# Patient Record
Sex: Female | Born: 1940 | Race: White | Hispanic: No | Smoking: Former smoker
Health system: Southern US, Community
[De-identification: ages and names within clinical notes are randomized; demographics above are authoritative.]

## PROBLEM LIST (undated history)

## (undated) DIAGNOSIS — J45909 Unspecified asthma, uncomplicated: Secondary | ICD-10-CM

## (undated) DIAGNOSIS — I493 Ventricular premature depolarization: Secondary | ICD-10-CM

## (undated) DIAGNOSIS — F419 Anxiety disorder, unspecified: Secondary | ICD-10-CM

## (undated) DIAGNOSIS — I10 Essential (primary) hypertension: Secondary | ICD-10-CM

## (undated) HISTORY — DX: Ventricular premature depolarization: I49.3

## (undated) HISTORY — PX: ABDOMINAL HYSTERECTOMY: SHX81

## (undated) HISTORY — DX: Essential (primary) hypertension: I10

## (undated) HISTORY — PX: NO PAST SURGERIES: SHX2092

## (undated) HISTORY — DX: Anxiety disorder, unspecified: F41.9

---

## 1999-04-07 ENCOUNTER — Ambulatory Visit (HOSPITAL_BASED_OUTPATIENT_CLINIC_OR_DEPARTMENT_OTHER): Admission: RE | Admit: 1999-04-07 | Discharge: 1999-04-07 | Payer: Self-pay | Admitting: General Surgery

## 1999-05-01 ENCOUNTER — Encounter: Admission: RE | Admit: 1999-05-01 | Discharge: 1999-07-30 | Payer: Self-pay | Admitting: *Deleted

## 1999-11-05 ENCOUNTER — Encounter: Admission: RE | Admit: 1999-11-05 | Discharge: 1999-11-05 | Payer: Self-pay | Admitting: General Surgery

## 1999-11-05 ENCOUNTER — Encounter: Payer: Self-pay | Admitting: General Surgery

## 1999-12-05 ENCOUNTER — Ambulatory Visit (HOSPITAL_COMMUNITY): Admission: RE | Admit: 1999-12-05 | Discharge: 1999-12-05 | Payer: Self-pay | Admitting: Hematology & Oncology

## 2000-04-05 ENCOUNTER — Encounter: Payer: Self-pay | Admitting: General Surgery

## 2000-04-05 ENCOUNTER — Encounter: Admission: RE | Admit: 2000-04-05 | Discharge: 2000-04-05 | Payer: Self-pay | Admitting: *Deleted

## 2000-04-26 ENCOUNTER — Ambulatory Visit (HOSPITAL_COMMUNITY): Admission: RE | Admit: 2000-04-26 | Discharge: 2000-04-26 | Payer: Self-pay | Admitting: Hematology & Oncology

## 2000-04-26 ENCOUNTER — Encounter: Payer: Self-pay | Admitting: Hematology & Oncology

## 2000-05-21 ENCOUNTER — Encounter: Payer: Self-pay | Admitting: Neurology

## 2000-05-21 ENCOUNTER — Ambulatory Visit (HOSPITAL_COMMUNITY): Admission: RE | Admit: 2000-05-21 | Discharge: 2000-05-21 | Payer: Self-pay | Admitting: Neurology

## 2000-08-12 ENCOUNTER — Other Ambulatory Visit: Admission: RE | Admit: 2000-08-12 | Discharge: 2000-08-12 | Payer: Self-pay | Admitting: Gynecology

## 2000-09-27 ENCOUNTER — Encounter: Payer: Self-pay | Admitting: Gynecology

## 2000-09-27 ENCOUNTER — Encounter: Payer: Self-pay | Admitting: General Surgery

## 2000-09-27 ENCOUNTER — Encounter: Admission: RE | Admit: 2000-09-27 | Discharge: 2000-09-27 | Payer: Self-pay | Admitting: General Surgery

## 2000-09-27 ENCOUNTER — Encounter: Admission: RE | Admit: 2000-09-27 | Discharge: 2000-09-27 | Payer: Self-pay | Admitting: Gynecology

## 2001-03-24 ENCOUNTER — Encounter: Payer: Self-pay | Admitting: General Surgery

## 2001-03-24 ENCOUNTER — Encounter: Admission: RE | Admit: 2001-03-24 | Discharge: 2001-03-24 | Payer: Self-pay | Admitting: Hematology & Oncology

## 2001-08-24 ENCOUNTER — Other Ambulatory Visit: Admission: RE | Admit: 2001-08-24 | Discharge: 2001-08-24 | Payer: Self-pay | Admitting: Gynecology

## 2001-10-17 ENCOUNTER — Encounter: Payer: Self-pay | Admitting: General Surgery

## 2001-10-17 ENCOUNTER — Encounter: Admission: RE | Admit: 2001-10-17 | Discharge: 2001-10-17 | Payer: Self-pay | Admitting: General Surgery

## 2002-08-28 ENCOUNTER — Other Ambulatory Visit: Admission: RE | Admit: 2002-08-28 | Discharge: 2002-08-28 | Payer: Self-pay | Admitting: Gynecology

## 2002-10-18 ENCOUNTER — Encounter: Payer: Self-pay | Admitting: General Surgery

## 2002-10-18 ENCOUNTER — Encounter: Admission: RE | Admit: 2002-10-18 | Discharge: 2002-10-18 | Payer: Self-pay | Admitting: General Surgery

## 2003-09-03 ENCOUNTER — Other Ambulatory Visit: Admission: RE | Admit: 2003-09-03 | Discharge: 2003-09-03 | Payer: Self-pay | Admitting: Gynecology

## 2003-11-06 ENCOUNTER — Encounter: Admission: RE | Admit: 2003-11-06 | Discharge: 2003-11-06 | Payer: Self-pay | Admitting: Hematology & Oncology

## 2004-09-03 ENCOUNTER — Ambulatory Visit: Payer: Self-pay | Admitting: Hematology & Oncology

## 2004-10-06 ENCOUNTER — Other Ambulatory Visit: Admission: RE | Admit: 2004-10-06 | Discharge: 2004-10-06 | Payer: Self-pay | Admitting: Gynecology

## 2004-11-11 ENCOUNTER — Encounter: Admission: RE | Admit: 2004-11-11 | Discharge: 2004-11-11 | Payer: Self-pay | Admitting: General Surgery

## 2004-11-11 ENCOUNTER — Encounter (INDEPENDENT_AMBULATORY_CARE_PROVIDER_SITE_OTHER): Payer: Self-pay | Admitting: *Deleted

## 2004-11-18 ENCOUNTER — Encounter: Admission: RE | Admit: 2004-11-18 | Discharge: 2004-11-18 | Payer: Self-pay | Admitting: General Surgery

## 2004-12-02 ENCOUNTER — Encounter (INDEPENDENT_AMBULATORY_CARE_PROVIDER_SITE_OTHER): Payer: Self-pay | Admitting: *Deleted

## 2004-12-02 ENCOUNTER — Encounter: Admission: RE | Admit: 2004-12-02 | Discharge: 2004-12-02 | Payer: Self-pay | Admitting: General Surgery

## 2004-12-02 ENCOUNTER — Ambulatory Visit (HOSPITAL_BASED_OUTPATIENT_CLINIC_OR_DEPARTMENT_OTHER): Admission: RE | Admit: 2004-12-02 | Discharge: 2004-12-02 | Payer: Self-pay | Admitting: General Surgery

## 2004-12-02 ENCOUNTER — Ambulatory Visit (HOSPITAL_COMMUNITY): Admission: RE | Admit: 2004-12-02 | Discharge: 2004-12-02 | Payer: Self-pay | Admitting: General Surgery

## 2005-03-03 ENCOUNTER — Ambulatory Visit: Payer: Self-pay | Admitting: Hematology & Oncology

## 2005-06-01 ENCOUNTER — Encounter: Admission: RE | Admit: 2005-06-01 | Discharge: 2005-06-01 | Payer: Self-pay | Admitting: General Surgery

## 2005-09-01 ENCOUNTER — Ambulatory Visit: Payer: Self-pay | Admitting: Hematology & Oncology

## 2005-10-27 ENCOUNTER — Other Ambulatory Visit: Admission: RE | Admit: 2005-10-27 | Discharge: 2005-10-27 | Payer: Self-pay | Admitting: Gynecology

## 2005-12-11 ENCOUNTER — Encounter: Admission: RE | Admit: 2005-12-11 | Discharge: 2005-12-11 | Payer: Self-pay | Admitting: Hematology & Oncology

## 2006-08-30 ENCOUNTER — Ambulatory Visit: Payer: Self-pay | Admitting: Hematology & Oncology

## 2006-09-01 LAB — COMPREHENSIVE METABOLIC PANEL
ALT: 20 U/L (ref 0–35)
AST: 24 U/L (ref 0–37)
Albumin: 4.1 g/dL (ref 3.5–5.2)
Alkaline Phosphatase: 67 U/L (ref 39–117)
BUN: 16 mg/dL (ref 6–23)
CO2: 27 mEq/L (ref 19–32)
Calcium: 9.4 mg/dL (ref 8.4–10.5)
Chloride: 103 mEq/L (ref 96–112)
Creatinine, Ser: 0.73 mg/dL (ref 0.40–1.20)
Glucose, Bld: 155 mg/dL — ABNORMAL HIGH (ref 70–99)
Potassium: 4.3 mEq/L (ref 3.5–5.3)
Sodium: 140 mEq/L (ref 135–145)
Total Bilirubin: 0.5 mg/dL (ref 0.3–1.2)
Total Protein: 6.9 g/dL (ref 6.0–8.3)

## 2006-09-01 LAB — CBC WITH DIFFERENTIAL/PLATELET
BASO%: 0.6 % (ref 0.0–2.0)
Basophils Absolute: 0 10*3/uL (ref 0.0–0.1)
EOS%: 5.8 % (ref 0.0–7.0)
Eosinophils Absolute: 0.3 10*3/uL (ref 0.0–0.5)
HCT: 40 % (ref 34.8–46.6)
HGB: 13.6 g/dL (ref 11.6–15.9)
LYMPH%: 23.3 % (ref 14.0–48.0)
MCH: 30.1 pg (ref 26.0–34.0)
MCHC: 34.2 g/dL (ref 32.0–36.0)
MCV: 88.2 fL (ref 81.0–101.0)
MONO#: 0.3 10*3/uL (ref 0.1–0.9)
MONO%: 7.8 % (ref 0.0–13.0)
NEUT#: 2.8 10*3/uL (ref 1.5–6.5)
NEUT%: 62.5 % (ref 39.6–76.8)
Platelets: 263 10*3/uL (ref 145–400)
RBC: 4.53 10*6/uL (ref 3.70–5.32)
RDW: 13.5 % (ref 11.3–14.5)
WBC: 4.5 10*3/uL (ref 3.9–10.0)
lymph#: 1 10*3/uL (ref 0.9–3.3)

## 2006-12-13 ENCOUNTER — Encounter: Admission: RE | Admit: 2006-12-13 | Discharge: 2006-12-13 | Payer: Self-pay | Admitting: Hematology & Oncology

## 2006-12-16 ENCOUNTER — Other Ambulatory Visit: Admission: RE | Admit: 2006-12-16 | Discharge: 2006-12-16 | Payer: Self-pay | Admitting: Gynecology

## 2007-01-25 ENCOUNTER — Ambulatory Visit: Payer: Self-pay | Admitting: Internal Medicine

## 2007-02-10 ENCOUNTER — Ambulatory Visit: Payer: Self-pay | Admitting: Internal Medicine

## 2007-09-07 ENCOUNTER — Ambulatory Visit: Payer: Self-pay | Admitting: Hematology & Oncology

## 2007-09-09 LAB — CBC WITH DIFFERENTIAL/PLATELET
BASO%: 0.9 % (ref 0.0–2.0)
Basophils Absolute: 0 10*3/uL (ref 0.0–0.1)
EOS%: 6 % (ref 0.0–7.0)
Eosinophils Absolute: 0.3 10*3/uL (ref 0.0–0.5)
HCT: 37.9 % (ref 34.8–46.6)
HGB: 13.3 g/dL (ref 11.6–15.9)
LYMPH%: 25.1 % (ref 14.0–48.0)
MCH: 30.9 pg (ref 26.0–34.0)
MCHC: 35.2 g/dL (ref 32.0–36.0)
MCV: 87.9 fL (ref 81.0–101.0)
MONO#: 0.4 10*3/uL (ref 0.1–0.9)
MONO%: 9.2 % (ref 0.0–13.0)
NEUT#: 2.8 10*3/uL (ref 1.5–6.5)
NEUT%: 58.8 % (ref 39.6–76.8)
Platelets: 215 10*3/uL (ref 145–400)
RBC: 4.31 10*6/uL (ref 3.70–5.32)
RDW: 13.3 % (ref 11.3–14.5)
WBC: 4.8 10*3/uL (ref 3.9–10.0)
lymph#: 1.2 10*3/uL (ref 0.9–3.3)

## 2007-09-09 LAB — COMPREHENSIVE METABOLIC PANEL
Albumin: 4.1 g/dL (ref 3.5–5.2)
BUN: 15 mg/dL (ref 6–23)
CO2: 25 mEq/L (ref 19–32)
Calcium: 8.8 mg/dL (ref 8.4–10.5)
Chloride: 104 mEq/L (ref 96–112)
Glucose, Bld: 97 mg/dL (ref 70–99)
Potassium: 4.3 mEq/L (ref 3.5–5.3)

## 2007-12-16 ENCOUNTER — Encounter: Admission: RE | Admit: 2007-12-16 | Discharge: 2007-12-16 | Payer: Self-pay | Admitting: Hematology & Oncology

## 2008-01-26 ENCOUNTER — Encounter: Admission: RE | Admit: 2008-01-26 | Discharge: 2008-01-26 | Payer: Self-pay | Admitting: Gynecology

## 2008-01-31 ENCOUNTER — Encounter (INDEPENDENT_AMBULATORY_CARE_PROVIDER_SITE_OTHER): Payer: Self-pay | Admitting: *Deleted

## 2008-09-19 ENCOUNTER — Ambulatory Visit: Payer: Self-pay | Admitting: Hematology & Oncology

## 2008-09-20 LAB — CBC WITH DIFFERENTIAL (CANCER CENTER ONLY)
BASO%: 0.7 % (ref 0.0–2.0)
EOS%: 7.5 % — ABNORMAL HIGH (ref 0.0–7.0)
HGB: 14 g/dL (ref 11.6–15.9)
LYMPH#: 1.4 10*3/uL (ref 0.9–3.3)
LYMPH%: 29.1 % (ref 14.0–48.0)
MCH: 30.5 pg (ref 26.0–34.0)
MCV: 88 fL (ref 81–101)
NEUT%: 55.4 % (ref 39.6–80.0)
Platelets: 217 10*3/uL (ref 145–400)
RBC: 4.6 10*6/uL (ref 3.70–5.32)
RDW: 11.3 % (ref 10.5–14.6)
WBC: 5 10*3/uL (ref 3.9–10.0)

## 2008-09-20 LAB — COMPREHENSIVE METABOLIC PANEL
ALT: 18 U/L (ref 0–35)
AST: 20 U/L (ref 0–37)
Albumin: 4.2 g/dL (ref 3.5–5.2)
Alkaline Phosphatase: 65 U/L (ref 39–117)
BUN: 18 mg/dL (ref 6–23)
CO2: 25 mEq/L (ref 19–32)
Calcium: 9.2 mg/dL (ref 8.4–10.5)
Chloride: 105 mEq/L (ref 96–112)
Creatinine, Ser: 0.76 mg/dL (ref 0.40–1.20)
Glucose, Bld: 148 mg/dL — ABNORMAL HIGH (ref 70–99)
Potassium: 5.1 mEq/L (ref 3.5–5.3)
Sodium: 140 mEq/L (ref 135–145)
Total Bilirubin: 0.7 mg/dL (ref 0.3–1.2)
Total Protein: 7 g/dL (ref 6.0–8.3)

## 2009-01-31 ENCOUNTER — Encounter: Admission: RE | Admit: 2009-01-31 | Discharge: 2009-01-31 | Payer: Self-pay | Admitting: Gynecology

## 2009-09-18 ENCOUNTER — Ambulatory Visit: Payer: Self-pay | Admitting: Hematology & Oncology

## 2009-09-19 LAB — CBC WITH DIFFERENTIAL (CANCER CENTER ONLY)
BASO#: 0 10*3/uL (ref 0.0–0.2)
EOS%: 5.8 % (ref 0.0–7.0)
Eosinophils Absolute: 0.3 10*3/uL (ref 0.0–0.5)
HGB: 13.9 g/dL (ref 11.6–15.9)
LYMPH#: 1.3 10*3/uL (ref 0.9–3.3)
NEUT#: 2.4 10*3/uL (ref 1.5–6.5)
Platelets: 215 10*3/uL (ref 145–400)
RBC: 4.59 10*6/uL (ref 3.70–5.32)
WBC: 4.3 10*3/uL (ref 3.9–10.0)

## 2009-09-20 LAB — COMPREHENSIVE METABOLIC PANEL
ALT: 18 U/L (ref 0–35)
AST: 22 U/L (ref 0–37)
Albumin: 4.5 g/dL (ref 3.5–5.2)
Alkaline Phosphatase: 69 U/L (ref 39–117)
BUN: 20 mg/dL (ref 6–23)
CO2: 27 mEq/L (ref 19–32)
Calcium: 9.2 mg/dL (ref 8.4–10.5)
Chloride: 104 mEq/L (ref 96–112)
Creatinine, Ser: 0.75 mg/dL (ref 0.40–1.20)
Glucose, Bld: 112 mg/dL — ABNORMAL HIGH (ref 70–99)
Potassium: 3.9 mEq/L (ref 3.5–5.3)
Sodium: 140 mEq/L (ref 135–145)
Total Bilirubin: 0.8 mg/dL (ref 0.3–1.2)
Total Protein: 7.3 g/dL (ref 6.0–8.3)

## 2009-09-20 LAB — VITAMIN D 25 HYDROXY (VIT D DEFICIENCY, FRACTURES): Vit D, 25-Hydroxy: 31 ng/mL (ref 30–89)

## 2010-02-11 ENCOUNTER — Encounter: Admission: RE | Admit: 2010-02-11 | Discharge: 2010-02-11 | Payer: Self-pay | Admitting: Hematology & Oncology

## 2010-09-17 ENCOUNTER — Ambulatory Visit: Payer: Self-pay | Admitting: Hematology & Oncology

## 2010-10-16 LAB — CBC WITH DIFFERENTIAL (CANCER CENTER ONLY)
BASO#: 0 10*3/uL (ref 0.0–0.2)
BASO%: 0.4 % (ref 0.0–2.0)
EOS%: 6.4 % (ref 0.0–7.0)
Eosinophils Absolute: 0.3 10*3/uL (ref 0.0–0.5)
HCT: 40.4 % (ref 34.8–46.6)
HGB: 13.6 g/dL (ref 11.6–15.9)
LYMPH#: 1.2 10*3/uL (ref 0.9–3.3)
LYMPH%: 27.8 % (ref 14.0–48.0)
MCH: 30.1 pg (ref 26.0–34.0)
MCHC: 33.7 g/dL (ref 32.0–36.0)
MCV: 89 fL (ref 81–101)
MONO#: 0.3 10*3/uL (ref 0.1–0.9)
MONO%: 7.6 % (ref 0.0–13.0)
NEUT#: 2.5 10*3/uL (ref 1.5–6.5)
NEUT%: 57.8 % (ref 39.6–80.0)
Platelets: 216 10*3/uL (ref 145–400)
RBC: 4.53 10*6/uL (ref 3.70–5.32)
RDW: 11.3 % (ref 10.5–14.6)
WBC: 4.2 10*3/uL (ref 3.9–10.0)

## 2010-10-17 LAB — COMPREHENSIVE METABOLIC PANEL
ALT: 15 U/L (ref 0–35)
AST: 24 U/L (ref 0–37)
Albumin: 4.3 g/dL (ref 3.5–5.2)
Alkaline Phosphatase: 62 U/L (ref 39–117)
BUN: 17 mg/dL (ref 6–23)
CO2: 27 mEq/L (ref 19–32)
Calcium: 9.3 mg/dL (ref 8.4–10.5)
Chloride: 104 mEq/L (ref 96–112)
Creatinine, Ser: 0.68 mg/dL (ref 0.40–1.20)
Glucose, Bld: 112 mg/dL — ABNORMAL HIGH (ref 70–99)
Potassium: 4 mEq/L (ref 3.5–5.3)
Sodium: 140 mEq/L (ref 135–145)
Total Bilirubin: 0.5 mg/dL (ref 0.3–1.2)
Total Protein: 6.9 g/dL (ref 6.0–8.3)

## 2010-10-17 LAB — TSH: TSH: 3.994 u[IU]/mL (ref 0.350–4.500)

## 2010-10-17 LAB — VITAMIN D 25 HYDROXY (VIT D DEFICIENCY, FRACTURES): Vit D, 25-Hydroxy: 58 ng/mL (ref 30–89)

## 2011-02-02 ENCOUNTER — Other Ambulatory Visit: Payer: Self-pay | Admitting: Gynecology

## 2011-02-02 DIAGNOSIS — Z1231 Encounter for screening mammogram for malignant neoplasm of breast: Secondary | ICD-10-CM

## 2011-02-13 ENCOUNTER — Ambulatory Visit
Admission: RE | Admit: 2011-02-13 | Discharge: 2011-02-13 | Disposition: A | Discharge status: Home / Self Care | Payer: Medicare Other | Source: Ambulatory Visit | Attending: Gynecology | Admitting: Gynecology

## 2011-02-13 DIAGNOSIS — Z1231 Encounter for screening mammogram for malignant neoplasm of breast: Secondary | ICD-10-CM

## 2011-03-06 NOTE — Op Note (Signed)
NAMELORRE, OPDAHL NO.:  [id number]   MEDICAL RECORD NO.:  [id number]          PATIENT TYPE:  AMB   LOCATION:  DSC                          FACILITY:  [facility]   PHYSICIAN:  Rose Phi. Maple Hudson, M.D.   DATE OF BIRTH:  Feb 09, 1941   DATE OF PROCEDURE:  12/02/2004  DATE OF DISCHARGE:                                 OPERATIVE REPORT   PREOPERATIVE DIAGNOSIS:  Abnormal left breast mammogram.   POSTOPERATIVE DIAGNOSIS:  Abnormal left breast mammogram.   OPERATION:  Left breast biopsy with needle localization and specimen  mammogram.   SURGEON:  Rose Phi. Maple Hudson, M.D.   ANESTHESIA:  MAC.   OPERATIVE PROCEDURE:  This 70 year old female had previously had a  lumpectomy and sentinel node biopsy and subsequent radiation therapy for  treatment of her left breast cancer about 5 years ago. Her recent mammogram  had showed a new abnormality in the medial portion of her left breast, away  from the lumpectomy site, and a core biopsy had shown a necrosis. MRI showed  enhancement and suggestion that this might be more than that and so she is  scheduled for excisional biopsy.   Prior to coming to the operating room, a localizing wire had been placed in  the left breast.   The patient was placed on the operating table with arms extended on the arm  boards. The left breast was prepped and draped in usual fashion. The target  point was at about the 9 o'clock position of her left breast and, using the  previously placed wire as a guide, a transverse incision was outlined over  the marked area. This area was then thoroughly infiltrated with 0.25%  Marcaine. Incision was made and the wire exposed and then the wire and  surrounding tissue were excised. Hemostasis obtained with cautery.   Specimen mammogram confirmed the removal of the lesion.   With good hemostasis, a subcuticular closure of 4-0 Monocryl and Steri-  Strips was carried out. Dressing applied. The patient transferred to  recovery room in satisfactory condition having tolerated procedure well.      PRY/MEDQ  D:  12/02/2004  T:  12/02/2004  Job:  [id number]

## 2011-03-06 NOTE — Assessment & Plan Note (Signed)
[facility]                             PULMONARY OFFICE NOTE   TANGANIKA, BARRADAS                       MRN:          [id number]  DATE:01/25/2007                            DOB:          1940-12-12    ALLERGY CONSULTATION   PROBLEM:  A 70 year old woman seen on kind referral from Dr. Allyson Sabal in  allergy consultation concerned about recurrent sinus infections and  asthma.   HISTORY:  She is a remote smoker.  She describes 40 years of recurrent  sinus infections never treated surgically and usually involving the  right side of her face.  She has tended to have sinus congestion and  asthma worst in the fall but common at season changes and apt to happen  at any time during the year.  Definite triggers include blowing leaves  and she said it happened that way this time.  She developed nasal  congestion not responsive to saline lavage, Nasonex, and a heated sinus  mask.  She took Augmentin with incomplete clearing.  Six weeks later she  started a second round of Augmentin.  She has been flying a lot,  visiting sick relatives, and believes the plane flights have contributed  to her problems.  She has been having daily asthma for several weeks and  is waking with cold sweats which is a new experience for her, different  from her endocrine hot flashes years ago.  She was given Avelox, which  caused nausea.  This was changed to Levaquin and she is completing a  week.  Lungs still feel full and she still has a pressure sensation  over the right maxillary sinus.  She has had several days when  temperature reached 100 degrees at home.  Nasal discharge is pea-green.   MEDICATION:  1. Calcium and multivitamins.  2. Paroxetine 20 mg.  3. Aspirin 81 mg.  4. Toprol-XL 25 mg.  5. Zyrtec.  6. Levaquin.   Drug intolerant of:  1. TETANUS TOXOID.  2. SULFA.  3. THEO-DUR.   P.r.n. use of a Maxair inhaler.   PAST HISTORY:  1. Hypertension.  2.  Palpitations.  3. Echocardiogram showing borderline left ventricular hypertrophy with      normal ejection fraction.  4. Asthma.  5. Left breast lumpectomy, radiation therapy and Arimidex (Dr. Francina Ames, Dr. Arlan Organ).  6. Recurrent sinusitis.  7. Skin test positive to mold, leaves, pollen, and horses as a child      with allergy vaccine then.  She avoids triggers.  8. 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  9. Contact dermatitis LATEX; no recognized problem with contrast dye      or aspirin.  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   SOCIAL HISTORY:  Quit smoking in 1980.  Occasional glass of wine.  Widowed with grown children, living alone.  She owns a business.  Walks  5 days per week.   FAMILY HISTORY:  Positive for emphysema, rheumatism, and cancer.  Daughter with strong insect sting sensitivity.  OBJECTIVE:  VITAL SIGNS:  Weight 166 pounds, BP 122/60, pulse regular at  68, room air saturation 98%.  HEENT:  Bilateral periorbital edema.  Conjunctivae are not injected.  Nasal airway is mildly crusted but not obstructed with no visible  polyps, no postnasal drip.  Voice quality is normal.  Palate spacing  2/4.  Oral mucosa normal.  No stridor.  No neck vein distention.  CHEST:  Clear.  HEART SOUNDS:  Regular without murmur or gallop.  EXTREMITIES:  Without cyanosis, clubbing or edema.   Chest x-ray:  She brings a film from [place] at [facility] dated  January 21, 2007, with no report.  To me the parenchyma looks clear and  heart size normal with no acute process.  There is a large gastric air  bubble suggesting aerophagia.   IMPRESSION:  1. Asthma.  2. Allergic rhinitis.  3. Recurrent rhinosinusitis, now persistent after partial response to      Augmentin and then to Levaquin.  4. History of tachypalpitations and hypertension, because of which she      is cautious about the use of stimulant medications.  We discussed       options in this regard.   PLAN:  1. We are going to extend her Levaquin 500 mg for 7 more days.  2. Cautious trial of Sudafed-PE as tolerated for nasal congestion.  3. Trial of Symbicort 160/4.5 two puffs b.i.d. with stimulant and      steroid side-effects.  4. We refilled her Maxair Autohaler for two puff q.i.d. p.r.n. use.  5. Schedule return 3 weeks, earlier p.r.n.     Clinton D. Maple Hudson, MD, Tonny Bollman, FACP  Electronically Signed    CDY/MedQ  DD: 01/25/2007  DT: 01/26/2007  Job #: [id number]   cc:   Nanetta Batty, M.D.  [facility] [employer]

## 2011-03-06 NOTE — Assessment & Plan Note (Signed)
[facility]                             PULMONARY OFFICE NOTE   Kayla Hays, Kayla Hays                       MRN:          [id number]  DATE:02/10/2007                            DOB:          1941-02-01    PROBLEM LIST:  1. Asthma.  2. Allergic rhinitis.  3. Recurrent sinusitis.  4. History of left breast cancer.   HISTORY:  She says she is breathing well with Symbicort and has not  needed her Maxair inhalers since last here.  She is still having some  right maxillary pressure pain.  Nasal discharge has changed from green  to white.  She forgot to try the Sudafed as we had discussed and  finished the Levaquin that she was taking when here last without further  antibiotic.  She is doing saline nasal lavage.  She has an ENT  appointment pending in May but does not remember who she is going to  see.   MEDICATIONS:  1. Paxil 20 mg.  2. Aspirin 81 mg.  3. Toprol XL 25 mg.  4. Zyrtec.  5. Symbicort 160/4.5 (tolerated without tachy palpitations).  6. Maxair rescue inhaler not being used routinely now.   DRUG INTOLERANCE:  TETANUS TOXOID, SULFA, THEO-DUR.   OBJECTIVE:  VITAL SIGNS: Weight 167 pounds.  Blood pressure 102/70,  pulse rate 76, room air saturation 97%.  HEENT:  Nasal mucosa is definitely pale and a bit boggy without polyps,  obvious mucus,  postnasal drip.  There is no adenopathy.  She does not  sound nasal.  There is no periorbital edema.  LUNGS:  Clear.   IMPRESSION:  1. Rhinitis, probable residual maxillary sinusitis.  2. Asthma, now well controlled on Symbicort.  We are wanting to be a      little careful with stimulant medications because of her cardiac      rhythm issue.   PLAN:  1. Nasal inhalation treatment with Neo-Synephrine.  2. Try Sudafed for tolerance hoping to get her right maxillary sinus      to drain.  3. Keep ENT appointment.  4. Repeat a week of Levaquin now at 500 mg daily.  5. Schedule return here in 6  months, earlier p.r.n.     Clinton D. Maple Hudson, MD, Tonny Bollman, FACP  Electronically Signed    CDY/MedQ  DD: 02/13/2007  DT: 02/13/2007  Job #: [id number]   cc:   [facility], [facility]  Nanetta Batty, M.D.

## 2011-05-11 ENCOUNTER — Telehealth: Payer: Self-pay | Admitting: Internal Medicine

## 2011-05-11 NOTE — Telephone Encounter (Signed)
Spoke with patient-aware that she needs to reestablish with [facility] as it has been over three years since she was seen last and she would need to call her PCP for any inhalers for the time being. Pt understood and will speak with her PCP; if she has any troubles with this and needs an appt she will call our office to speak with myself.

## 2011-10-15 ENCOUNTER — Other Ambulatory Visit: Payer: Self-pay | Admitting: Hematology & Oncology

## 2011-10-15 ENCOUNTER — Ambulatory Visit (HOSPITAL_BASED_OUTPATIENT_CLINIC_OR_DEPARTMENT_OTHER): Payer: Medicare Other | Admitting: Hematology & Oncology

## 2011-10-15 ENCOUNTER — Other Ambulatory Visit (HOSPITAL_BASED_OUTPATIENT_CLINIC_OR_DEPARTMENT_OTHER): Payer: Medicare Other | Admitting: Lab

## 2011-10-15 VITALS — BP 138/86 | HR 74 | Temp 97.0°F | Ht 65.5 in | Wt 145.0 lb

## 2011-10-15 DIAGNOSIS — C50919 Malignant neoplasm of unspecified site of unspecified female breast: Secondary | ICD-10-CM

## 2011-10-15 DIAGNOSIS — Z87898 Personal history of other specified conditions: Secondary | ICD-10-CM

## 2011-10-15 LAB — CBC WITH DIFFERENTIAL (CANCER CENTER ONLY)
BASO%: 1.2 % (ref 0.0–2.0)
EOS%: 3.1 % (ref 0.0–7.0)
HGB: 13.6 g/dL (ref 11.6–15.9)
LYMPH#: 0.9 10*3/uL (ref 0.9–3.3)
MCHC: 33.7 g/dL (ref 32.0–36.0)
NEUT#: 3.4 10*3/uL (ref 1.5–6.5)
RDW: 12.6 % (ref 11.1–15.7)

## 2011-10-15 LAB — COMPREHENSIVE METABOLIC PANEL
ALT: 19 U/L (ref 0–35)
AST: 23 U/L (ref 0–37)
Albumin: 4.4 g/dL (ref 3.5–5.2)
Alkaline Phosphatase: 60 U/L (ref 39–117)
Potassium: 4.3 mEq/L (ref 3.5–5.3)
Sodium: 136 mEq/L (ref 135–145)
Total Bilirubin: 0.7 mg/dL (ref 0.3–1.2)
Total Protein: 7.1 g/dL (ref 6.0–8.3)

## 2011-10-16 LAB — COMPREHENSIVE METABOLIC PANEL
Albumin: 4.4 g/dL (ref 3.5–5.2)
Albumin: 4.4 g/dL (ref 3.5–5.2)
Alkaline Phosphatase: 60 U/L (ref 39–117)
Alkaline Phosphatase: 60 U/L (ref 39–117)
BUN: 19 mg/dL (ref 6–23)
Calcium: 9.5 mg/dL (ref 8.4–10.5)
Chloride: 101 mEq/L (ref 96–112)
Creatinine, Ser: 0.78 mg/dL (ref 0.50–1.10)
Glucose, Bld: 147 mg/dL — ABNORMAL HIGH (ref 70–99)
Glucose, Bld: 147 mg/dL — ABNORMAL HIGH (ref 70–99)
Potassium: 4.3 mEq/L (ref 3.5–5.3)
Potassium: 4.3 mEq/L (ref 3.5–5.3)
Sodium: 136 mEq/L (ref 135–145)
Total Bilirubin: 0.7 mg/dL (ref 0.3–1.2)
Total Protein: 7.1 g/dL (ref 6.0–8.3)
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
## 2011-10-27 DIAGNOSIS — F411 Generalized anxiety disorder: Secondary | ICD-10-CM | POA: Diagnosis not present

## 2011-10-27 DIAGNOSIS — Z23 Encounter for immunization: Secondary | ICD-10-CM | POA: Diagnosis not present

## 2011-11-19 DIAGNOSIS — M79609 Pain in unspecified limb: Secondary | ICD-10-CM | POA: Diagnosis not present

## 2011-11-30 DIAGNOSIS — I10 Essential (primary) hypertension: Secondary | ICD-10-CM | POA: Diagnosis not present

## 2011-11-30 DIAGNOSIS — J309 Allergic rhinitis, unspecified: Secondary | ICD-10-CM | POA: Diagnosis not present

## 2011-11-30 DIAGNOSIS — Z853 Personal history of malignant neoplasm of breast: Secondary | ICD-10-CM | POA: Diagnosis not present

## 2011-11-30 DIAGNOSIS — Z0000 Encounter for general adult medical examination without abnormal findings: Secondary | ICD-10-CM | POA: Diagnosis not present

## 2011-11-30 DIAGNOSIS — R82998 Other abnormal findings in urine: Secondary | ICD-10-CM | POA: Diagnosis not present

## 2011-11-30 DIAGNOSIS — G459 Transient cerebral ischemic attack, unspecified: Secondary | ICD-10-CM | POA: Diagnosis not present

## 2011-11-30 DIAGNOSIS — Z23 Encounter for immunization: Secondary | ICD-10-CM | POA: Diagnosis not present

## 2011-11-30 DIAGNOSIS — J45909 Unspecified asthma, uncomplicated: Secondary | ICD-10-CM | POA: Diagnosis not present

## 2011-11-30 DIAGNOSIS — D0590 Unspecified type of carcinoma in situ of unspecified breast: Secondary | ICD-10-CM | POA: Diagnosis not present

## 2012-02-01 DIAGNOSIS — B373 Candidiasis of vulva and vagina: Secondary | ICD-10-CM | POA: Diagnosis not present

## 2012-03-19 DIAGNOSIS — R300 Dysuria: Secondary | ICD-10-CM | POA: Diagnosis not present

## 2012-03-29 ENCOUNTER — Other Ambulatory Visit: Payer: Self-pay | Admitting: Hematology & Oncology

## 2012-03-29 DIAGNOSIS — Z1231 Encounter for screening mammogram for malignant neoplasm of breast: Secondary | ICD-10-CM

## 2012-03-30 DIAGNOSIS — B373 Candidiasis of vulva and vagina: Secondary | ICD-10-CM | POA: Diagnosis not present

## 2012-04-20 ENCOUNTER — Ambulatory Visit
Admission: RE | Admit: 2012-04-20 | Discharge: 2012-04-20 | Disposition: A | Discharge status: Home / Self Care | Payer: Medicare Other | Source: Ambulatory Visit | Attending: Hematology & Oncology | Admitting: Hematology & Oncology

## 2012-04-20 DIAGNOSIS — Z1231 Encounter for screening mammogram for malignant neoplasm of breast: Secondary | ICD-10-CM

## 2012-05-07 DIAGNOSIS — W57XXXA Bitten or stung by nonvenomous insect and other nonvenomous arthropods, initial encounter: Secondary | ICD-10-CM | POA: Diagnosis not present

## 2012-05-11 DIAGNOSIS — I10 Essential (primary) hypertension: Secondary | ICD-10-CM | POA: Diagnosis not present

## 2012-05-30 DIAGNOSIS — F411 Generalized anxiety disorder: Secondary | ICD-10-CM | POA: Diagnosis not present

## 2012-07-14 ENCOUNTER — Other Ambulatory Visit: Payer: Self-pay

## 2012-07-14 DIAGNOSIS — C44621 Squamous cell carcinoma of skin of unspecified upper limb, including shoulder: Secondary | ICD-10-CM | POA: Diagnosis not present

## 2012-07-14 DIAGNOSIS — L821 Other seborrheic keratosis: Secondary | ICD-10-CM | POA: Diagnosis not present

## 2012-08-02 DIAGNOSIS — H2510 Age-related nuclear cataract, unspecified eye: Secondary | ICD-10-CM | POA: Diagnosis not present

## 2012-08-04 ENCOUNTER — Other Ambulatory Visit: Payer: Self-pay

## 2012-08-04 DIAGNOSIS — C44621 Squamous cell carcinoma of skin of unspecified upper limb, including shoulder: Secondary | ICD-10-CM | POA: Diagnosis not present

## 2012-08-27 DIAGNOSIS — Z23 Encounter for immunization: Secondary | ICD-10-CM | POA: Diagnosis not present

## 2012-11-03 DIAGNOSIS — L570 Actinic keratosis: Secondary | ICD-10-CM | POA: Diagnosis not present

## 2012-11-03 DIAGNOSIS — L821 Other seborrheic keratosis: Secondary | ICD-10-CM | POA: Diagnosis not present

## 2012-11-22 ENCOUNTER — Other Ambulatory Visit: Payer: Self-pay | Admitting: Obstetrics and Gynecology

## 2012-11-22 DIAGNOSIS — E2839 Other primary ovarian failure: Secondary | ICD-10-CM | POA: Diagnosis not present

## 2012-11-22 DIAGNOSIS — Z1231 Encounter for screening mammogram for malignant neoplasm of breast: Secondary | ICD-10-CM

## 2012-11-22 DIAGNOSIS — M899 Disorder of bone, unspecified: Secondary | ICD-10-CM

## 2012-11-22 DIAGNOSIS — Z01419 Encounter for gynecological examination (general) (routine) without abnormal findings: Secondary | ICD-10-CM | POA: Diagnosis not present

## 2012-11-22 DIAGNOSIS — T3309XA Superficial frostbite of other part of head, initial encounter: Secondary | ICD-10-CM | POA: Diagnosis not present

## 2012-11-22 DIAGNOSIS — M949 Disorder of cartilage, unspecified: Secondary | ICD-10-CM | POA: Diagnosis not present

## 2013-02-10 DIAGNOSIS — H01119 Allergic dermatitis of unspecified eye, unspecified eyelid: Secondary | ICD-10-CM | POA: Diagnosis not present

## 2013-04-16 ENCOUNTER — Other Ambulatory Visit: Payer: Self-pay | Admitting: Cardiovascular Disease

## 2013-04-18 DIAGNOSIS — J019 Acute sinusitis, unspecified: Secondary | ICD-10-CM | POA: Diagnosis not present

## 2013-04-24 ENCOUNTER — Ambulatory Visit: Payer: Medicare Other

## 2013-04-24 ENCOUNTER — Other Ambulatory Visit: Payer: Medicare Other

## 2013-05-09 DIAGNOSIS — R413 Other amnesia: Secondary | ICD-10-CM | POA: Diagnosis not present

## 2013-05-09 DIAGNOSIS — R5383 Other fatigue: Secondary | ICD-10-CM | POA: Diagnosis not present

## 2013-05-09 DIAGNOSIS — F411 Generalized anxiety disorder: Secondary | ICD-10-CM | POA: Diagnosis not present

## 2013-05-09 DIAGNOSIS — R5381 Other malaise: Secondary | ICD-10-CM | POA: Diagnosis not present

## 2013-05-09 DIAGNOSIS — F341 Dysthymic disorder: Secondary | ICD-10-CM | POA: Diagnosis not present

## 2013-05-09 DIAGNOSIS — F329 Major depressive disorder, single episode, unspecified: Secondary | ICD-10-CM | POA: Diagnosis not present

## 2013-05-12 DIAGNOSIS — F0390 Unspecified dementia without behavioral disturbance: Secondary | ICD-10-CM | POA: Diagnosis not present

## 2013-05-12 DIAGNOSIS — F09 Unspecified mental disorder due to known physiological condition: Secondary | ICD-10-CM | POA: Diagnosis not present

## 2013-05-12 DIAGNOSIS — F411 Generalized anxiety disorder: Secondary | ICD-10-CM | POA: Diagnosis not present

## 2013-05-12 DIAGNOSIS — R413 Other amnesia: Secondary | ICD-10-CM | POA: Diagnosis not present

## 2013-05-18 ENCOUNTER — Encounter: Payer: Self-pay | Admitting: Cardiovascular Disease

## 2013-05-18 ENCOUNTER — Ambulatory Visit (INDEPENDENT_AMBULATORY_CARE_PROVIDER_SITE_OTHER): Payer: Medicare Other | Admitting: Cardiovascular Disease

## 2013-05-18 VITALS — BP 160/90 | HR 62 | Ht 68.0 in | Wt 139.0 lb

## 2013-05-18 DIAGNOSIS — R4182 Altered mental status, unspecified: Secondary | ICD-10-CM | POA: Diagnosis not present

## 2013-05-18 DIAGNOSIS — R413 Other amnesia: Secondary | ICD-10-CM

## 2013-05-18 DIAGNOSIS — Z09 Encounter for follow-up examination after completed treatment for conditions other than malignant neoplasm: Secondary | ICD-10-CM | POA: Diagnosis not present

## 2013-05-18 DIAGNOSIS — I4949 Other premature depolarization: Secondary | ICD-10-CM | POA: Diagnosis not present

## 2013-05-18 DIAGNOSIS — I10 Essential (primary) hypertension: Secondary | ICD-10-CM

## 2013-05-18 DIAGNOSIS — I493 Ventricular premature depolarization: Secondary | ICD-10-CM

## 2013-05-18 DIAGNOSIS — G3109 Other frontotemporal dementia: Secondary | ICD-10-CM | POA: Diagnosis not present

## 2013-05-18 DIAGNOSIS — F0280 Dementia in other diseases classified elsewhere without behavioral disturbance: Secondary | ICD-10-CM | POA: Diagnosis not present

## 2013-05-18 DIAGNOSIS — G309 Alzheimer's disease, unspecified: Secondary | ICD-10-CM | POA: Insufficient documentation

## 2013-05-18 NOTE — Progress Notes (Signed)
05/18/2013 Kayla Hays   06-Nov-1940  [id number]  Primary Physician Karleen Hampshire, MD Primary Cardiologist:Gonsalo Cuthbertson Erlene Quan MD FACP,FACC,FAHA, FSCAI   HPI:  The patient is a very pleasant, 72 year old, thin-appearing, widowed Caucasian female, mother of 3, grandmother to 4 grandchildren who I last saw 7 months ago. She was at one time married to one of my patients, Anabel Halon, who died 8 years ago. She is the mother of 2 children, grandmother to 4 grandchildren (2 boys and 2 girls.) She is under a lot of stress both personally and professionally and had been dating a new gentleman who lives in [place] and driving back and forth. I adjusted her medications which resulted in improvement in her symptoms and her blood pressure. Since I saw her a year ago she's remained cardiograph was stable. She had a sudden episode of short-term memory loss and is seeing a psychologist in [place] who is embarking on a workup for TIA/stroke with multiple imaging studies.   Current Outpatient Prescriptions  Medication Sig Dispense Refill  . albuterol (PROVENTIL HFA;VENTOLIN HFA) 108 (90 BASE) MCG/ACT inhaler Inhale 2 puffs into the lungs every 6 (six) hours as needed for wheezing.      Marland Kitchen aspirin 81 MG tablet Take 81 mg by mouth daily. 2 tablets daily      . Calcium Carbonate-Vitamin D (CALCIUM 500 + D PO) Take 500 mg by mouth daily.      . cetirizine (ZYRTEC) 10 MG tablet Take 10 mg by mouth daily.      Marland Kitchen escitalopram (LEXAPRO) 10 MG tablet Take 10 mg by mouth daily.        Marland Kitchen lisinopril (PRINIVIL,ZESTRIL) 2.5 MG tablet Take 2.5 mg by mouth daily.        Marland Kitchen LORazepam (ATIVAN) 0.5 MG tablet Take 0.5 mg by mouth every 8 (eight) hours.        . metoprolol succinate (TOPROL-XL) 50 MG 24 hr tablet TAKE 1 1/2 TABLET BY MOUTH EVERY DAY  45 tablet  1  . Multiple Vitamin (MULTIVITAMIN) capsule Take 1 capsule by mouth daily.       No current facility-administered medications for this visit.    Allergies    Allergen Reactions  . Tetanus Toxoids Other (See Comments)    Passes out  . Theophyllines Other (See Comments)    Passes out    History   Social History  . Marital Status: Widowed    Spouse Name: N/A    Number of Children: N/A  . Years of Education: N/A   Occupational History  . Not on file.   Social History Main Topics  . Smoking status: Former Smoker    Quit date: 05/18/1988  . Smokeless tobacco: Not on file  . Alcohol Use: Not on file  . Drug Use: Not on file  . Sexually Active: Not on file   Other Topics Concern  . Not on file   Social History Narrative  . No narrative on file     Review of Systems: General: negative for chills, fever, night sweats or weight changes.  Cardiovascular: negative for chest pain, dyspnea on exertion, edema, orthopnea, palpitations, paroxysmal nocturnal dyspnea or shortness of breath Dermatological: negative for rash Respiratory: negative for cough or wheezing Urologic: negative for hematuria Abdominal: negative for nausea, vomiting, diarrhea, bright red blood per rectum, melena, or hematemesis Neurologic: negative for visual changes, syncope, or dizziness All other systems reviewed and are otherwise negative except as noted above.    Blood  pressure 160/90, pulse 62, height 5\' 8"  (1.727 m), weight 139 lb (63.05 kg).  General appearance: alert and no distress Neck: no adenopathy, no carotid bruit, no JVD, supple, symmetrical, trachea midline and thyroid not enlarged, symmetric, no tenderness/mass/nodules Lungs: clear to auscultation bilaterally Heart: regular rate and rhythm, S1, S2 normal, no murmur, click, rub or gallop Extremities: extremities normal, atraumatic, no cyanosis or edema  EKG sinus rhythm at 62 without ST or T wave changes  ASSESSMENT AND PLAN:   Essential hypertension On low-dose lisinopril as well as metoprolol with borderline control probably related to recent stress  Memory loss Patient has had recent  sudden short-term memory loss for unclear reasons. She is seeing a psychologist in [place] who has ordered a number of imaging studies to querying whether or not she had a TIA or stroke.      Runell Gess MD FACP,FACC,FAHA, [facility] 05/18/2013 5:35 PM

## 2013-05-18 NOTE — Assessment & Plan Note (Signed)
On low-dose lisinopril as well as metoprolol with borderline control probably related to recent stress

## 2013-05-18 NOTE — Patient Instructions (Addendum)
Your physician wants you to follow-up in: 12 months with Dr Berry. You will receive a reminder letter in the mail two months in advance. If you don't receive a letter, please call our office to schedule the follow-up appointment.  

## 2013-05-18 NOTE — Assessment & Plan Note (Signed)
Patient has had recent sudden short-term memory loss for unclear reasons. She is seeing a psychologist in [place] who has ordered a number of imaging studies to querying whether or not she had a TIA or stroke.

## 2013-05-19 ENCOUNTER — Encounter: Payer: Self-pay | Admitting: Cardiovascular Disease

## 2013-05-22 DIAGNOSIS — F0390 Unspecified dementia without behavioral disturbance: Secondary | ICD-10-CM | POA: Diagnosis not present

## 2013-05-23 ENCOUNTER — Ambulatory Visit
Admission: RE | Admit: 2013-05-23 | Discharge: 2013-05-23 | Disposition: A | Discharge status: Home / Self Care | Payer: Medicare Other | Source: Ambulatory Visit | Attending: Obstetrics and Gynecology | Admitting: Obstetrics and Gynecology

## 2013-05-23 DIAGNOSIS — Z1231 Encounter for screening mammogram for malignant neoplasm of breast: Secondary | ICD-10-CM | POA: Diagnosis not present

## 2013-05-23 DIAGNOSIS — M949 Disorder of cartilage, unspecified: Secondary | ICD-10-CM | POA: Diagnosis not present

## 2013-06-07 ENCOUNTER — Other Ambulatory Visit: Payer: Self-pay | Admitting: Cardiovascular Disease

## 2013-06-07 DIAGNOSIS — F0390 Unspecified dementia without behavioral disturbance: Secondary | ICD-10-CM | POA: Diagnosis not present

## 2013-06-07 NOTE — Telephone Encounter (Signed)
Rx was sent to pharmacy electronically. 

## 2013-06-13 ENCOUNTER — Other Ambulatory Visit: Payer: Self-pay | Admitting: Cardiovascular Disease

## 2013-06-14 NOTE — Telephone Encounter (Signed)
Rx was sent to pharmacy electronically. 

## 2013-07-19 DIAGNOSIS — H2510 Age-related nuclear cataract, unspecified eye: Secondary | ICD-10-CM | POA: Diagnosis not present

## 2013-07-25 DIAGNOSIS — F09 Unspecified mental disorder due to known physiological condition: Secondary | ICD-10-CM | POA: Diagnosis not present

## 2013-07-31 DIAGNOSIS — H10029 Other mucopurulent conjunctivitis, unspecified eye: Secondary | ICD-10-CM | POA: Diagnosis not present

## 2013-08-03 DIAGNOSIS — B300 Keratoconjunctivitis due to adenovirus: Secondary | ICD-10-CM | POA: Diagnosis not present

## 2013-08-06 DIAGNOSIS — H268 Other specified cataract: Secondary | ICD-10-CM | POA: Diagnosis not present

## 2013-08-06 DIAGNOSIS — Z79899 Other long term (current) drug therapy: Secondary | ICD-10-CM | POA: Diagnosis not present

## 2013-08-06 DIAGNOSIS — H109 Unspecified conjunctivitis: Secondary | ICD-10-CM | POA: Diagnosis not present

## 2013-08-06 DIAGNOSIS — Z9079 Acquired absence of other genital organ(s): Secondary | ICD-10-CM | POA: Diagnosis not present

## 2013-08-07 DIAGNOSIS — S058X9A Other injuries of unspecified eye and orbit, initial encounter: Secondary | ICD-10-CM | POA: Diagnosis not present

## 2013-08-08 DIAGNOSIS — S058X9A Other injuries of unspecified eye and orbit, initial encounter: Secondary | ICD-10-CM | POA: Diagnosis not present

## 2013-08-11 DIAGNOSIS — H16209 Unspecified keratoconjunctivitis, unspecified eye: Secondary | ICD-10-CM | POA: Diagnosis not present

## 2013-08-16 DIAGNOSIS — H16209 Unspecified keratoconjunctivitis, unspecified eye: Secondary | ICD-10-CM | POA: Diagnosis not present

## 2013-08-30 DIAGNOSIS — H16209 Unspecified keratoconjunctivitis, unspecified eye: Secondary | ICD-10-CM | POA: Diagnosis not present

## 2013-08-30 DIAGNOSIS — H1710 Central corneal opacity, unspecified eye: Secondary | ICD-10-CM | POA: Diagnosis not present

## 2013-09-20 DIAGNOSIS — H16209 Unspecified keratoconjunctivitis, unspecified eye: Secondary | ICD-10-CM | POA: Diagnosis not present

## 2013-09-20 DIAGNOSIS — H1710 Central corneal opacity, unspecified eye: Secondary | ICD-10-CM | POA: Diagnosis not present

## 2013-10-30 DIAGNOSIS — F411 Generalized anxiety disorder: Secondary | ICD-10-CM | POA: Diagnosis not present

## 2013-11-14 DIAGNOSIS — J45909 Unspecified asthma, uncomplicated: Secondary | ICD-10-CM | POA: Diagnosis not present

## 2013-11-14 DIAGNOSIS — G3184 Mild cognitive impairment, so stated: Secondary | ICD-10-CM | POA: Diagnosis not present

## 2013-11-14 DIAGNOSIS — R413 Other amnesia: Secondary | ICD-10-CM | POA: Diagnosis not present

## 2013-11-14 DIAGNOSIS — F341 Dysthymic disorder: Secondary | ICD-10-CM | POA: Diagnosis not present

## 2013-12-22 DIAGNOSIS — R197 Diarrhea, unspecified: Secondary | ICD-10-CM | POA: Diagnosis not present

## 2013-12-22 DIAGNOSIS — R152 Fecal urgency: Secondary | ICD-10-CM | POA: Diagnosis not present

## 2013-12-25 DIAGNOSIS — J45909 Unspecified asthma, uncomplicated: Secondary | ICD-10-CM | POA: Diagnosis not present

## 2013-12-25 DIAGNOSIS — J019 Acute sinusitis, unspecified: Secondary | ICD-10-CM | POA: Diagnosis not present

## 2013-12-25 DIAGNOSIS — J309 Allergic rhinitis, unspecified: Secondary | ICD-10-CM | POA: Diagnosis not present

## 2013-12-25 DIAGNOSIS — R197 Diarrhea, unspecified: Secondary | ICD-10-CM | POA: Diagnosis not present

## 2014-01-29 DIAGNOSIS — F411 Generalized anxiety disorder: Secondary | ICD-10-CM | POA: Diagnosis not present

## 2014-03-14 ENCOUNTER — Other Ambulatory Visit: Payer: Self-pay | Admitting: Cardiovascular Disease

## 2014-03-14 NOTE — Telephone Encounter (Signed)
Rx was sent to pharmacy electronically. 

## 2014-03-21 ENCOUNTER — Telehealth: Payer: Self-pay | Admitting: Cardiovascular Disease

## 2014-03-23 NOTE — Telephone Encounter (Signed)
Closed encounter °

## 2014-05-21 ENCOUNTER — Encounter: Payer: Self-pay | Admitting: Cardiovascular Disease

## 2014-05-21 ENCOUNTER — Ambulatory Visit (INDEPENDENT_AMBULATORY_CARE_PROVIDER_SITE_OTHER): Payer: Medicare Other | Admitting: Cardiovascular Disease

## 2014-05-21 VITALS — BP 128/72 | HR 77 | Ht 67.0 in | Wt 131.0 lb

## 2014-05-21 DIAGNOSIS — I10 Essential (primary) hypertension: Secondary | ICD-10-CM

## 2014-05-21 DIAGNOSIS — R413 Other amnesia: Secondary | ICD-10-CM

## 2014-05-21 NOTE — Assessment & Plan Note (Signed)
After extensive testing in [place] and [facility] she now has a diagnosis of "early Alzheimer's disease".

## 2014-05-21 NOTE — Patient Instructions (Signed)
Your physician wants you to follow-up in: 1 year with Dr Berry. You will receive a reminder letter in the mail two months in advance. If you don't receive a letter, please call our office to schedule the follow-up appointment.  

## 2014-05-21 NOTE — Progress Notes (Signed)
05/21/2014 Kayla Hays   Jun 30, 1941  [id number]  Primary Physician Malka So, MD Primary Cardiologist: Lorretta Harp MD Renae Gloss   HPI:  The patient is a very pleasant, 73 year old, thin-appearing, widowed Caucasian female, mother of 67, grandmother to 4 grandchildren who I last saw 12 months ago. She was at one time married to one of my patients, Vincent Gros, who died 8 years ago. She is the mother of 2 children, grandmother to 4 grandchildren (2 boys and 2 girls.) She is under a lot of stress both personally and professionally and had been dating a new gentleman who lives in [place] and driving back and forth. I adjusted her medications which resulted in improvement in her symptoms and her blood pressure. Since I saw her a year ago she's remained cardiograph was stable. She had a sudden episode of short-term memory loss and is seeing a psychologist in [place] who is embarking on a workup for TIA/stroke with multiple imaging studies. 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 She is otherwise asymptomatic.    Current Outpatient Prescriptions  Medication Sig Dispense Refill  . aspirin 81 MG tablet Take 81 mg by mouth daily. 2 tablets daily      . Calcium Carbonate-Vitamin D (CALCIUM 500 + D PO) Take 500 mg by mouth daily.      . cetirizine (ZYRTEC) 10 MG tablet Take 10 mg by mouth daily.      Marland Kitchen escitalopram (LEXAPRO) 10 MG tablet Take 10 mg by mouth daily.        Marland Kitchen FLOVENT HFA 44 MCG/ACT inhaler Inhale 1 puff into the lungs as needed.      Marland Kitchen lisinopril (PRINIVIL,ZESTRIL) 5 MG tablet TAKE 1 TABLET BY MOUTH DAILY OR AS DIRECTED  30 tablet  1  . LORazepam (ATIVAN) 0.5 MG tablet Take 0.5 mg by mouth every 8 (eight) hours.        . metoprolol succinate (TOPROL-XL) 50 MG 24 hr tablet TAKE 1 1/2 TABLETS BY MOUTH EVERY DAY  45 tablet  11  . Multiple Vitamin (MULTIVITAMIN) capsule Take 1 capsule by mouth daily.      Marland Kitchen NAMENDA XR 28 MG  CP24 Take 1 tablet by mouth daily.       No current facility-administered medications for this visit.    Allergies  Allergen Reactions  . Tetanus Toxoids Other (See Comments)    Passes out  . Theophyllines Other (See Comments)    Passes out    History   Social History  . Marital Status: Widowed    Spouse Name: N/A    Number of Children: N/A  . Years of Education: N/A   Occupational History  . Not on file.   Social History Main Topics  . Smoking status: Former Smoker    Quit date: 05/18/1988  . Smokeless tobacco: Not on file  . Alcohol Use: Not on file  . Drug Use: Not on file  . Sexual Activity: Not on file   Other Topics Concern  . Not on file   Social History Narrative  . No narrative on file     Review of Systems: General: negative for chills, fever, night sweats or weight changes.  Cardiovascular: negative for chest pain, dyspnea on exertion, edema, orthopnea, palpitations, paroxysmal nocturnal dyspnea or shortness of breath Dermatological: negative for rash Respiratory: negative for cough or wheezing Urologic: negative for hematuria Abdominal: negative for nausea, vomiting, diarrhea, bright red  blood per rectum, melena, or hematemesis Neurologic: negative for visual changes, syncope, or dizziness All other systems reviewed and are otherwise negative except as noted above.    Blood pressure 128/72, pulse 77, height 5\' 7"  (1.702 m), weight 131 lb (59.421 kg).  General appearance: alert and no distress Neck: no adenopathy, no carotid bruit, no JVD, supple, symmetrical, trachea midline and thyroid not enlarged, symmetric, no tenderness/mass/nodules Lungs: clear to auscultation bilaterally Heart: regular rate and rhythm, S1, S2 normal, no murmur, click, rub or gallop Extremities: extremities normal, atraumatic, no cyanosis or edema  EKG normal sinus rhythm at 77 without ST or T wave changes  ASSESSMENT AND PLAN:   Essential hypertension Controlled on  current medications  Memory loss After extensive testing in [place] and [facility] she now has a diagnosis of "early Alzheimer's disease".      Lorretta Harp MD FACP,FACC,FAHA, [facility] 05/21/2014 4:11 PM

## 2014-05-21 NOTE — Assessment & Plan Note (Signed)
Controlled on current medications 

## 2014-05-30 DIAGNOSIS — R152 Fecal urgency: Secondary | ICD-10-CM | POA: Diagnosis not present

## 2014-06-29 ENCOUNTER — Other Ambulatory Visit: Payer: Self-pay | Admitting: Cardiovascular Disease

## 2014-06-29 NOTE — Telephone Encounter (Signed)
Rx refill sent to patient pharmacy   

## 2014-06-30 ENCOUNTER — Other Ambulatory Visit: Payer: Self-pay | Admitting: Cardiovascular Disease

## 2014-07-02 NOTE — Telephone Encounter (Signed)
Rx was sent to pharmacy electronically. 

## 2014-07-30 DIAGNOSIS — R197 Diarrhea, unspecified: Secondary | ICD-10-CM | POA: Diagnosis not present

## 2014-09-11 DIAGNOSIS — G3184 Mild cognitive impairment, so stated: Secondary | ICD-10-CM | POA: Diagnosis not present

## 2014-09-11 DIAGNOSIS — F418 Other specified anxiety disorders: Secondary | ICD-10-CM | POA: Diagnosis not present

## 2014-09-17 ENCOUNTER — Telehealth: Payer: Self-pay | Admitting: Cardiovascular Disease

## 2014-09-17 NOTE — Telephone Encounter (Signed)
I have talked to this pt.s daughter and cleared up her med issue

## 2014-09-17 NOTE — Telephone Encounter (Signed)
Jill(daughter) is calling in get some clarity on the pt's dosages for her medications. Please call  Thanks

## 2014-10-20 DIAGNOSIS — R413 Other amnesia: Secondary | ICD-10-CM | POA: Diagnosis not present

## 2014-10-20 DIAGNOSIS — Z79899 Other long term (current) drug therapy: Secondary | ICD-10-CM | POA: Diagnosis not present

## 2014-10-20 DIAGNOSIS — R079 Chest pain, unspecified: Secondary | ICD-10-CM | POA: Diagnosis not present

## 2014-10-20 DIAGNOSIS — J449 Chronic obstructive pulmonary disease, unspecified: Secondary | ICD-10-CM | POA: Diagnosis not present

## 2014-10-20 DIAGNOSIS — G319 Degenerative disease of nervous system, unspecified: Secondary | ICD-10-CM | POA: Diagnosis not present

## 2014-10-20 DIAGNOSIS — R0600 Dyspnea, unspecified: Secondary | ICD-10-CM | POA: Diagnosis not present

## 2014-10-20 DIAGNOSIS — R4182 Altered mental status, unspecified: Secondary | ICD-10-CM | POA: Diagnosis not present

## 2014-10-20 DIAGNOSIS — R51 Headache: Secondary | ICD-10-CM | POA: Diagnosis not present

## 2014-10-26 DIAGNOSIS — G301 Alzheimer's disease with late onset: Secondary | ICD-10-CM | POA: Diagnosis not present

## 2014-10-26 DIAGNOSIS — F419 Anxiety disorder, unspecified: Secondary | ICD-10-CM | POA: Diagnosis not present

## 2014-10-26 DIAGNOSIS — F0280 Dementia in other diseases classified elsewhere without behavioral disturbance: Secondary | ICD-10-CM | POA: Diagnosis not present

## 2014-10-26 DIAGNOSIS — F329 Major depressive disorder, single episode, unspecified: Secondary | ICD-10-CM | POA: Diagnosis not present

## 2014-10-31 DIAGNOSIS — Z01419 Encounter for gynecological examination (general) (routine) without abnormal findings: Secondary | ICD-10-CM | POA: Diagnosis not present

## 2014-10-31 DIAGNOSIS — Z1231 Encounter for screening mammogram for malignant neoplasm of breast: Secondary | ICD-10-CM | POA: Diagnosis not present

## 2014-10-31 DIAGNOSIS — Z780 Asymptomatic menopausal state: Secondary | ICD-10-CM | POA: Diagnosis not present

## 2014-11-13 DIAGNOSIS — H109 Unspecified conjunctivitis: Secondary | ICD-10-CM | POA: Diagnosis not present

## 2014-11-13 DIAGNOSIS — J069 Acute upper respiratory infection, unspecified: Secondary | ICD-10-CM | POA: Diagnosis not present

## 2014-12-03 DIAGNOSIS — H1031 Unspecified acute conjunctivitis, right eye: Secondary | ICD-10-CM | POA: Diagnosis not present

## 2014-12-26 DIAGNOSIS — H524 Presbyopia: Secondary | ICD-10-CM | POA: Diagnosis not present

## 2014-12-26 DIAGNOSIS — H1851 Endothelial corneal dystrophy: Secondary | ICD-10-CM | POA: Diagnosis not present

## 2014-12-26 DIAGNOSIS — H16223 Keratoconjunctivitis sicca, not specified as Sjogren's, bilateral: Secondary | ICD-10-CM | POA: Diagnosis not present

## 2014-12-26 DIAGNOSIS — H2513 Age-related nuclear cataract, bilateral: Secondary | ICD-10-CM | POA: Diagnosis not present

## 2015-01-17 ENCOUNTER — Other Ambulatory Visit: Payer: Self-pay | Admitting: Cardiovascular Disease

## 2015-01-18 DIAGNOSIS — J45909 Unspecified asthma, uncomplicated: Secondary | ICD-10-CM | POA: Diagnosis not present

## 2015-01-18 DIAGNOSIS — L2389 Allergic contact dermatitis due to other agents: Secondary | ICD-10-CM | POA: Diagnosis not present

## 2015-02-12 DIAGNOSIS — R300 Dysuria: Secondary | ICD-10-CM | POA: Diagnosis not present

## 2015-02-12 DIAGNOSIS — J0100 Acute maxillary sinusitis, unspecified: Secondary | ICD-10-CM | POA: Diagnosis not present

## 2015-02-17 DIAGNOSIS — T1592XA Foreign body on external eye, part unspecified, left eye, initial encounter: Secondary | ICD-10-CM | POA: Diagnosis not present

## 2015-03-13 ENCOUNTER — Encounter: Payer: Self-pay | Admitting: Cardiovascular Disease

## 2015-03-31 DIAGNOSIS — N390 Urinary tract infection, site not specified: Secondary | ICD-10-CM | POA: Diagnosis not present

## 2015-04-04 DIAGNOSIS — R300 Dysuria: Secondary | ICD-10-CM | POA: Diagnosis not present

## 2015-05-03 DIAGNOSIS — F0280 Dementia in other diseases classified elsewhere without behavioral disturbance: Secondary | ICD-10-CM | POA: Diagnosis not present

## 2015-05-03 DIAGNOSIS — F418 Other specified anxiety disorders: Secondary | ICD-10-CM | POA: Diagnosis not present

## 2015-05-03 DIAGNOSIS — G308 Other Alzheimer's disease: Secondary | ICD-10-CM | POA: Diagnosis not present

## 2015-05-05 ENCOUNTER — Other Ambulatory Visit: Payer: Self-pay | Admitting: Cardiovascular Disease

## 2015-05-06 ENCOUNTER — Other Ambulatory Visit: Payer: Self-pay | Admitting: Cardiovascular Disease

## 2015-06-01 ENCOUNTER — Other Ambulatory Visit: Payer: Self-pay | Admitting: Cardiovascular Disease

## 2015-06-03 NOTE — Telephone Encounter (Signed)
Rx(s) sent to pharmacy electronically.  

## 2015-06-06 ENCOUNTER — Other Ambulatory Visit: Payer: Self-pay

## 2015-06-06 ENCOUNTER — Other Ambulatory Visit: Payer: Self-pay | Admitting: Cardiovascular Disease

## 2015-06-06 MED ORDER — METOPROLOL SUCCINATE ER 50 MG PO TB24: ORAL_TABLET | ORAL | Status: DC

## 2015-06-06 NOTE — Addendum Note (Signed)
Addended by: Diana Eves on: 06/06/2015 04:17 PM   Modules accepted: Orders

## 2015-06-06 NOTE — Telephone Encounter (Signed)
Rx(s) sent to pharmacy electronically.  

## 2015-06-07 NOTE — Telephone Encounter (Signed)
REFILL 

## 2015-06-29 DIAGNOSIS — N390 Urinary tract infection, site not specified: Secondary | ICD-10-CM | POA: Diagnosis not present

## 2015-07-18 DIAGNOSIS — N390 Urinary tract infection, site not specified: Secondary | ICD-10-CM | POA: Diagnosis not present

## 2015-08-13 ENCOUNTER — Telehealth: Payer: Self-pay | Admitting: Cardiovascular Disease

## 2015-08-13 MED ORDER — METOPROLOL SUCCINATE ER 50 MG PO TB24: 50.0000 mg | ORAL_TABLET | Freq: Every day | ORAL | Status: DC

## 2015-08-13 NOTE — Telephone Encounter (Signed)
Returned call to patient's care giver Broadus John no answer.Left message on personal voice mail Dr.Berry advised ok to simplify medical regimen.Advised to take metoprolol succ 50 mg daily.Advised to monitor B/P and call back if becomes elevated.

## 2015-08-13 NOTE — Telephone Encounter (Signed)
That's fine with me. Please simplify her medical regimen

## 2015-08-13 NOTE — Telephone Encounter (Signed)
Kayla Hays called in requesting that her prescription for Metoprolol either be changed to 1 tab a day or 2 tabs a day because she is in the early stages of Dementia and it would make it easier for her. Please f/u with him or the pt  Thanks

## 2015-08-13 NOTE — Telephone Encounter (Signed)
Returned call to patient's caregiver Broadus John.He stated patient has early alzheimer's and it will make it easier for her if directions changed for metoprolol.Stated it would be better if she could take 50 mg 1 tablet or 2 tablets instead of 1&1/2 tablets.Message sent to [facility] for advice.

## 2015-08-14 DIAGNOSIS — S92355A Nondisplaced fracture of fifth metatarsal bone, left foot, initial encounter for closed fracture: Secondary | ICD-10-CM | POA: Diagnosis not present

## 2015-08-14 DIAGNOSIS — S99929A Unspecified injury of unspecified foot, initial encounter: Secondary | ICD-10-CM | POA: Diagnosis not present

## 2015-08-19 DIAGNOSIS — Z79899 Other long term (current) drug therapy: Secondary | ICD-10-CM | POA: Diagnosis not present

## 2015-08-19 DIAGNOSIS — N952 Postmenopausal atrophic vaginitis: Secondary | ICD-10-CM | POA: Diagnosis not present

## 2015-08-19 DIAGNOSIS — I10 Essential (primary) hypertension: Secondary | ICD-10-CM | POA: Diagnosis not present

## 2015-08-19 DIAGNOSIS — R300 Dysuria: Secondary | ICD-10-CM | POA: Diagnosis not present

## 2015-08-19 DIAGNOSIS — Z23 Encounter for immunization: Secondary | ICD-10-CM | POA: Diagnosis not present

## 2015-08-21 DIAGNOSIS — M25572 Pain in left ankle and joints of left foot: Secondary | ICD-10-CM | POA: Diagnosis not present

## 2015-08-21 DIAGNOSIS — S92355A Nondisplaced fracture of fifth metatarsal bone, left foot, initial encounter for closed fracture: Secondary | ICD-10-CM | POA: Diagnosis not present

## 2015-08-21 DIAGNOSIS — S93612A Sprain of tarsal ligament of left foot, initial encounter: Secondary | ICD-10-CM | POA: Diagnosis not present

## 2015-09-06 DIAGNOSIS — M25572 Pain in left ankle and joints of left foot: Secondary | ICD-10-CM | POA: Diagnosis not present

## 2015-09-10 DIAGNOSIS — M79673 Pain in unspecified foot: Secondary | ICD-10-CM | POA: Diagnosis not present

## 2015-09-10 DIAGNOSIS — B351 Tinea unguium: Secondary | ICD-10-CM | POA: Diagnosis not present

## 2015-09-27 DIAGNOSIS — S92355D Nondisplaced fracture of fifth metatarsal bone, left foot, subsequent encounter for fracture with routine healing: Secondary | ICD-10-CM | POA: Diagnosis not present

## 2015-09-30 ENCOUNTER — Other Ambulatory Visit: Payer: Self-pay | Admitting: Cardiovascular Disease

## 2015-09-30 ENCOUNTER — Other Ambulatory Visit: Payer: Self-pay | Admitting: *Deleted

## 2015-09-30 MED ORDER — LISINOPRIL 5 MG PO TABS: 5.0000 mg | ORAL_TABLET | Freq: Every day | ORAL | Status: DC

## 2015-09-30 NOTE — Telephone Encounter (Signed)
LISINOPRIL refilled electronically with note needs to make an appt for future refills.

## 2015-09-30 NOTE — Telephone Encounter (Signed)
REFILL 

## 2015-09-30 NOTE — Telephone Encounter (Signed)
°*  STAT* If patient is at the pharmacy, call can be transferred to refill team.   1. Which medications need to be refilled? (please list name of each medication and dose if known) Lisinopril  2. Which pharmacy/location (including street and city if local pharmacy) is medication to be sent to?[phone] 3. Do they need a 30 day or 90 day supply? 90 and refills

## 2015-10-23 DIAGNOSIS — S92355D Nondisplaced fracture of fifth metatarsal bone, left foot, subsequent encounter for fracture with routine healing: Secondary | ICD-10-CM | POA: Diagnosis not present

## 2015-10-23 DIAGNOSIS — M25571 Pain in right ankle and joints of right foot: Secondary | ICD-10-CM | POA: Diagnosis not present

## 2015-11-06 DIAGNOSIS — L728 Other follicular cysts of the skin and subcutaneous tissue: Secondary | ICD-10-CM | POA: Diagnosis not present

## 2015-11-06 DIAGNOSIS — D2339 Other benign neoplasm of skin of other parts of face: Secondary | ICD-10-CM | POA: Diagnosis not present

## 2015-11-20 DIAGNOSIS — L0390 Cellulitis, unspecified: Secondary | ICD-10-CM | POA: Diagnosis not present

## 2015-11-20 DIAGNOSIS — N390 Urinary tract infection, site not specified: Secondary | ICD-10-CM | POA: Diagnosis not present

## 2015-11-20 DIAGNOSIS — R350 Frequency of micturition: Secondary | ICD-10-CM | POA: Diagnosis not present

## 2015-11-20 DIAGNOSIS — L309 Dermatitis, unspecified: Secondary | ICD-10-CM | POA: Diagnosis not present

## 2015-12-23 ENCOUNTER — Other Ambulatory Visit: Payer: Self-pay | Admitting: Cardiovascular Disease

## 2015-12-23 NOTE — Telephone Encounter (Signed)
Rx(s) sent to pharmacy electronically.  

## 2015-12-23 NOTE — Telephone Encounter (Signed)
Lisinopril already refilled for #30 - patient has not seen MD since 05/2014 - cannot authorize #90

## 2016-01-13 DIAGNOSIS — R300 Dysuria: Secondary | ICD-10-CM | POA: Diagnosis not present

## 2016-01-13 DIAGNOSIS — H1031 Unspecified acute conjunctivitis, right eye: Secondary | ICD-10-CM | POA: Diagnosis not present

## 2016-01-13 DIAGNOSIS — N390 Urinary tract infection, site not specified: Secondary | ICD-10-CM | POA: Diagnosis not present

## 2016-01-14 DIAGNOSIS — G309 Alzheimer's disease, unspecified: Secondary | ICD-10-CM | POA: Diagnosis not present

## 2016-01-14 DIAGNOSIS — R300 Dysuria: Secondary | ICD-10-CM | POA: Diagnosis not present

## 2016-01-14 DIAGNOSIS — N390 Urinary tract infection, site not specified: Secondary | ICD-10-CM | POA: Diagnosis not present

## 2016-01-17 DIAGNOSIS — H2513 Age-related nuclear cataract, bilateral: Secondary | ICD-10-CM | POA: Diagnosis not present

## 2016-01-27 ENCOUNTER — Other Ambulatory Visit: Payer: Self-pay | Admitting: Cardiovascular Disease

## 2016-01-27 NOTE — Telephone Encounter (Signed)
REFILL 

## 2016-02-07 DIAGNOSIS — F0280 Dementia in other diseases classified elsewhere without behavioral disturbance: Secondary | ICD-10-CM | POA: Diagnosis not present

## 2016-02-07 DIAGNOSIS — G300 Alzheimer's disease with early onset: Secondary | ICD-10-CM | POA: Diagnosis not present

## 2016-02-09 DIAGNOSIS — I10 Essential (primary) hypertension: Secondary | ICD-10-CM | POA: Diagnosis not present

## 2016-02-09 DIAGNOSIS — J0100 Acute maxillary sinusitis, unspecified: Secondary | ICD-10-CM | POA: Diagnosis not present

## 2016-02-10 NOTE — Progress Notes (Signed)
Cardiology Office Note:    Date:  02/11/2016   ID:  Kayla Hays, DOB Jan 17, 1941, MRN [id number]  PCP:  Kayla So, MD  Cardiologist:  Dr. Quay Hays   Electrophysiologist:  n/a  Referring MD: Kayla Harp, MD   Chief Complaint  Patient presents with  . Follow-up    HTN    History of Present Illness:     Kayla Hays is a 75 y.o. female with a hx of HTN, PVCs, anxiety. Last seen by Dr. Gwenlyn Hays 8/15.  She lives in [place], [place].  Here today with her friend for follow up.  She has been dx with Alzheimer's and he helps a little with the hx.  She notes recent hx of chest discomfort and dyspnea with more extreme exertion.  It is a heavy sensation.  No assoc symptoms.  It is not getting worse.  She denies syncope, orthopnea, PND, edema.     Past Medical History  Diagnosis Date  . Hypertension   . PVC's (premature ventricular contractions)   . Anxiety     Past Surgical History  Procedure Laterality Date  . No past surgeries      Current Medications: Outpatient Prescriptions Prior to Visit  Medication Sig Dispense Refill  . aspirin 81 MG tablet Take 81 mg by mouth.     . cetirizine (ZYRTEC) 10 MG tablet Take 10 mg by mouth daily.    Marland Kitchen FLOVENT HFA 44 MCG/ACT inhaler Inhale 1 puff into the lungs as needed (FOR SHORTNESS OF BREATH AND WHEEZING).     Marland Kitchen metoprolol succinate (TOPROL-XL) 50 MG 24 hr tablet Take 1 tablet (50 mg total) by mouth daily. Take with or immediately following a meal. 30 tablet 6  . Multiple Vitamin (MULTIVITAMIN) capsule Take 1 capsule by mouth daily.    Marland Kitchen lisinopril (PRINIVIL,ZESTRIL) 5 MG tablet Take 1 tablet (5 mg total) by mouth daily. NEED OV. 30 tablet 0  . Calcium Carbonate-Vitamin D (CALCIUM 500 + D PO) Take 500 mg by mouth daily.    Marland Kitchen escitalopram (LEXAPRO) 10 MG tablet Take 10 mg by mouth daily. Reported on 02/11/2016    . LORazepam (ATIVAN) 0.5 MG tablet Take 0.5 mg by mouth every 8 (eight) hours. Reported on 02/11/2016    . NAMENDA XR  28 MG CP24 Take 1 tablet by mouth daily. Reported on 02/11/2016     No facility-administered medications prior to visit.     Allergies:   Tetanus toxoids; Theophyllines; Penicillins; Rivastigmine tartrate; and Sulfa antibiotics   Social History   Social History  . Marital Status: Widowed    Spouse Name: N/A  . Number of Children: N/A  . Years of Education: N/A   Social History Main Topics  . Smoking status: Former Smoker    Quit date: 05/18/1988  . Smokeless tobacco: None  . Alcohol Use: None  . Drug Use: None  . Sexual Activity: Not Asked   Other Topics Concern  . None   Social History Narrative     Family History:  The patient's family history includes Breast cancer in her mother; Cancer in her paternal grandfather; Heart disease in her maternal grandfather.   ROS:   Please see the history of present illness.    Review of Systems  Cardiovascular: Positive for chest pain.  Respiratory: Positive for shortness of breath.   Psychiatric/Behavioral: Positive for depression. The patient is nervous/anxious.    All other systems reviewed and are negative.   Physical Exam:  VS:  BP 116/62 mmHg  Pulse 64  Ht 5\' 7"  (1.702 m)  Wt 129 lb 9.6 oz (58.786 kg)  BMI 20.29 kg/m2  SpO2 94%   GEN: Well nourished, well developed, in no acute distress HEENT: normal Neck: no JVD, no masses Cardiac: Normal S1/S2, RRR; no murmurs, rubs, or gallops, no edema;     Respiratory:  clear to auscultation bilaterally; no wheezing, rhonchi or rales GI: soft, nontender, nondistended MS: no deformity or atrophy Skin: warm and dry Neuro: No focal deficits  Psych: Alert and oriented x 3, normal affect  Wt Readings from Last 3 Encounters:  02/11/16 129 lb 9.6 oz (58.786 kg)  05/21/14 131 lb (59.421 kg)  05/18/13 139 lb (63.05 kg)      Studies/Labs Reviewed:     EKG:  EKG is  ordered today.  The ekg ordered today demonstrates NSR, HR 65, normal axis, NSSTTW changes, QTc 445 ms, no  changes  Recent Labs: No results Hays for requested labs within last 365 days.   Recent Lipid Panel No results Hays for: CHOL, TRIG, HDL, CHOLHDL, VLDL, LDLCALC, LDLDIRECT  Additional studies/ records that were reviewed today include:   Echo 9/06 Borderline LVH, EF >/= 55%, mild LAE, MAC, trace MR, mild TR, mild PI  ASSESSMENT:     1. Precordial pain   2. Shortness of breath   3. Essential hypertension     PLAN:     In order of problems listed above:  1. Chest pain - She notes some exertional chest pain and shortness of breath.  ECG is unchanged. She lives in [place].  I have given her a Rx to take to her friend's cardiologist in [facility] to request a stress myoview and an Echocardiogram.  Obtain BMET, CBC, BNP today.    2. Shortness of breath - As noted, obtain labs, echo and stress test.    3. HTN - BP looks good today.  Her friend shows me a list of readings from home with avg SBP 150. I would prefer that we monitor her BP for now.  She will call me with readings after several weeks.  If above target, I will adjust her Lisinopril.  O/w FU with Dr. Quay Hays in 1 year.    Medication Adjustments/Labs and Tests Ordered: Current medicines are reviewed at length with the patient today.  Concerns regarding medicines are outlined above.  Medication changes, Labs and Tests ordered today are outlined in the Patient Instructions noted below. Patient Instructions  Medication Instructions:  No changes.  See your medication list. I have sent a refill in to your pharmacy for Lisinopril.  Labwork: Today - BMET, CBC, BNP. Testing/Procedures: Please have your Cardiologist in [facility] schedule: Exercise Nuclear Stress Test Echocardiogram He/she can use the diagnosis: chest pain, shortness of breath. Follow-Up: Dr. Quay Hays in 1 year or sooner if needed. Any Other Special Instructions Will Be Listed Below (If Applicable). Check BP 1 time a day for 2-3 weeks and send me the  results. If you need a refill on your cardiac medications before your next appointment, please call your pharmacy.    Signed, Richardson Dopp, PA-C  02/11/2016 4:48 PM    [facility] [place], [place], [address]  [address] Phone: [phone]; Fax: [phone]

## 2016-02-11 ENCOUNTER — Encounter: Payer: Self-pay | Admitting: Physician Assistant

## 2016-02-11 ENCOUNTER — Ambulatory Visit (INDEPENDENT_AMBULATORY_CARE_PROVIDER_SITE_OTHER): Payer: Medicare Other | Admitting: Physician Assistant

## 2016-02-11 VITALS — BP 116/62 | HR 64 | Ht 67.0 in | Wt 129.6 lb

## 2016-02-11 DIAGNOSIS — I10 Essential (primary) hypertension: Secondary | ICD-10-CM | POA: Diagnosis not present

## 2016-02-11 DIAGNOSIS — R072 Precordial pain: Secondary | ICD-10-CM

## 2016-02-11 DIAGNOSIS — R0602 Shortness of breath: Secondary | ICD-10-CM | POA: Diagnosis not present

## 2016-02-11 MED ORDER — LISINOPRIL 5 MG PO TABS: 5.0000 mg | ORAL_TABLET | Freq: Every day | ORAL | Status: DC

## 2016-02-11 NOTE — Patient Instructions (Addendum)
Medication Instructions:  No changes.  See your medication list. I have sent a refill in to your pharmacy for Lisinopril.  Labwork: Today - BMET, CBC, BNP. Testing/Procedures: Please have your Cardiologist in [facility] schedule: Exercise Nuclear Stress Test Echocardiogram He/she can use the diagnosis: chest pain, shortness of breath. Follow-Up: Dr. Quay Burow in 1 year or sooner if needed. Any Other Special Instructions Will Be Listed Below (If Applicable). Check BP 1 time a day for 2-3 weeks and send me the results. If you need a refill on your cardiac medications before your next appointment, please call your pharmacy.

## 2016-02-12 ENCOUNTER — Telehealth: Payer: Self-pay | Admitting: *Deleted

## 2016-02-12 LAB — CBC WITH DIFFERENTIAL/PLATELET
BASOS PCT: 1 %
Basophils Absolute: 54 cells/uL (ref 0–200)
Eosinophils Absolute: 270 cells/uL (ref 15–500)
Eosinophils Relative: 5 %
HEMATOCRIT: 39.2 % (ref 35.0–45.0)
Hemoglobin: 13.2 g/dL (ref 11.7–15.5)
LYMPHS PCT: 22 %
Lymphs Abs: 1188 cells/uL (ref 850–3900)
MCH: 31 pg (ref 27.0–33.0)
MCHC: 33.7 g/dL (ref 32.0–36.0)
MCV: 92 fL (ref 80.0–100.0)
MONO ABS: 648 {cells}/uL (ref 200–950)
MONOS PCT: 12 %
MPV: 8.8 fL (ref 7.5–12.5)
NEUTROS ABS: 3240 {cells}/uL (ref 1500–7800)
Neutrophils Relative %: 60 %
PLATELETS: 213 10*3/uL (ref 140–400)
RBC: 4.26 MIL/uL (ref 3.80–5.10)
RDW: 13.3 % (ref 11.0–15.0)
WBC: 5.4 10*3/uL (ref 3.8–10.8)

## 2016-02-12 LAB — BASIC METABOLIC PANEL
BUN: 19 mg/dL (ref 7–25)
CHLORIDE: 106 mmol/L (ref 98–110)
CO2: 22 mmol/L (ref 20–31)
Calcium: 9.3 mg/dL (ref 8.6–10.4)
Creat: 0.71 mg/dL (ref 0.60–0.93)
Glucose, Bld: 89 mg/dL (ref 65–99)
POTASSIUM: 3.9 mmol/L (ref 3.5–5.3)
Sodium: 142 mmol/L (ref 135–146)

## 2016-02-12 LAB — BRAIN NATRIURETIC PEPTIDE: Brain Natriuretic Peptide: 113.1 pg/mL — ABNORMAL HIGH (ref <100)

## 2016-02-12 NOTE — Telephone Encounter (Signed)
Pt has been notified of lab results by phone with verbal understanding. 

## 2016-02-19 ENCOUNTER — Telehealth: Payer: Self-pay | Admitting: Cardiovascular Disease

## 2016-02-19 NOTE — Telephone Encounter (Signed)
Error  She needed medical records

## 2016-03-03 DIAGNOSIS — I340 Nonrheumatic mitral (valve) insufficiency: Secondary | ICD-10-CM | POA: Diagnosis not present

## 2016-03-03 DIAGNOSIS — R079 Chest pain, unspecified: Secondary | ICD-10-CM | POA: Diagnosis not present

## 2016-03-03 DIAGNOSIS — R0602 Shortness of breath: Secondary | ICD-10-CM | POA: Diagnosis not present

## 2016-03-06 ENCOUNTER — Telehealth: Payer: Self-pay | Admitting: Physician Assistant

## 2016-03-06 DIAGNOSIS — N761 Subacute and chronic vaginitis: Secondary | ICD-10-CM | POA: Diagnosis not present

## 2016-03-06 DIAGNOSIS — R350 Frequency of micturition: Secondary | ICD-10-CM | POA: Diagnosis not present

## 2016-03-06 DIAGNOSIS — R300 Dysuria: Secondary | ICD-10-CM | POA: Diagnosis not present

## 2016-03-06 DIAGNOSIS — N952 Postmenopausal atrophic vaginitis: Secondary | ICD-10-CM | POA: Diagnosis not present

## 2016-03-06 NOTE — Telephone Encounter (Signed)
Pt notified of both echo and myoview results were normal Both of these test were perfomed in [facility], [place]. Both test are in Care everywhere. Pt and her friend Vicente Masson that she gave permission to s/w are aware of results.

## 2016-03-06 NOTE — Telephone Encounter (Signed)
Echocardiogram performed at [facility] in [place], [place] 03/03/16 EF 55-60%, no RWMA, mild TR, PASP 32 mmHg  Myoview 03/03/16 Normal, EF 70%  Both studies available in Care Everywhere.  Please tell patient her echo and stress test are normal. Continue with current treatment plan. Richardson Dopp, PA-C   03/06/2016 2:27 PM

## 2016-03-26 DIAGNOSIS — N952 Postmenopausal atrophic vaginitis: Secondary | ICD-10-CM | POA: Diagnosis not present

## 2016-04-03 DIAGNOSIS — N390 Urinary tract infection, site not specified: Secondary | ICD-10-CM | POA: Diagnosis not present

## 2016-04-03 DIAGNOSIS — G309 Alzheimer's disease, unspecified: Secondary | ICD-10-CM | POA: Diagnosis not present

## 2016-04-03 DIAGNOSIS — R3915 Urgency of urination: Secondary | ICD-10-CM | POA: Diagnosis not present

## 2016-04-03 DIAGNOSIS — R350 Frequency of micturition: Secondary | ICD-10-CM | POA: Diagnosis not present

## 2016-04-24 DIAGNOSIS — G309 Alzheimer's disease, unspecified: Secondary | ICD-10-CM | POA: Diagnosis not present

## 2016-04-24 DIAGNOSIS — R350 Frequency of micturition: Secondary | ICD-10-CM | POA: Diagnosis not present

## 2016-05-26 DIAGNOSIS — G219 Secondary parkinsonism, unspecified: Secondary | ICD-10-CM | POA: Diagnosis not present

## 2016-05-26 DIAGNOSIS — G301 Alzheimer's disease with late onset: Secondary | ICD-10-CM | POA: Diagnosis not present

## 2016-05-26 DIAGNOSIS — F0280 Dementia in other diseases classified elsewhere without behavioral disturbance: Secondary | ICD-10-CM | POA: Diagnosis not present

## 2016-06-05 DIAGNOSIS — L821 Other seborrheic keratosis: Secondary | ICD-10-CM | POA: Diagnosis not present

## 2016-06-05 DIAGNOSIS — D485 Neoplasm of uncertain behavior of skin: Secondary | ICD-10-CM | POA: Diagnosis not present

## 2016-06-05 DIAGNOSIS — L570 Actinic keratosis: Secondary | ICD-10-CM | POA: Diagnosis not present

## 2016-06-05 DIAGNOSIS — C44319 Basal cell carcinoma of skin of other parts of face: Secondary | ICD-10-CM | POA: Diagnosis not present

## 2016-06-05 DIAGNOSIS — L820 Inflamed seborrheic keratosis: Secondary | ICD-10-CM | POA: Diagnosis not present

## 2016-06-05 DIAGNOSIS — D1801 Hemangioma of skin and subcutaneous tissue: Secondary | ICD-10-CM | POA: Diagnosis not present

## 2016-06-05 DIAGNOSIS — B079 Viral wart, unspecified: Secondary | ICD-10-CM | POA: Diagnosis not present

## 2016-06-05 DIAGNOSIS — D235 Other benign neoplasm of skin of trunk: Secondary | ICD-10-CM | POA: Diagnosis not present

## 2016-07-30 DIAGNOSIS — H5203 Hypermetropia, bilateral: Secondary | ICD-10-CM | POA: Diagnosis not present

## 2016-07-30 DIAGNOSIS — H1851 Endothelial corneal dystrophy: Secondary | ICD-10-CM | POA: Diagnosis not present

## 2016-07-30 DIAGNOSIS — H04123 Dry eye syndrome of bilateral lacrimal glands: Secondary | ICD-10-CM | POA: Diagnosis not present

## 2016-07-30 DIAGNOSIS — H524 Presbyopia: Secondary | ICD-10-CM | POA: Diagnosis not present

## 2016-07-30 DIAGNOSIS — H11153 Pinguecula, bilateral: Secondary | ICD-10-CM | POA: Diagnosis not present

## 2016-07-30 DIAGNOSIS — H2513 Age-related nuclear cataract, bilateral: Secondary | ICD-10-CM | POA: Diagnosis not present

## 2016-08-04 DIAGNOSIS — G309 Alzheimer's disease, unspecified: Secondary | ICD-10-CM | POA: Diagnosis not present

## 2016-08-04 DIAGNOSIS — J4520 Mild intermittent asthma, uncomplicated: Secondary | ICD-10-CM | POA: Diagnosis not present

## 2016-08-04 DIAGNOSIS — Z23 Encounter for immunization: Secondary | ICD-10-CM | POA: Diagnosis not present

## 2016-08-04 DIAGNOSIS — I10 Essential (primary) hypertension: Secondary | ICD-10-CM | POA: Diagnosis not present

## 2016-08-04 DIAGNOSIS — J309 Allergic rhinitis, unspecified: Secondary | ICD-10-CM | POA: Diagnosis not present

## 2016-08-04 DIAGNOSIS — I493 Ventricular premature depolarization: Secondary | ICD-10-CM | POA: Diagnosis not present

## 2016-08-04 DIAGNOSIS — Z130 Encounter for screening for diseases of the blood and blood-forming organs and certain disorders involving the immune mechanism: Secondary | ICD-10-CM | POA: Diagnosis not present

## 2016-08-04 DIAGNOSIS — Z111 Encounter for screening for respiratory tuberculosis: Secondary | ICD-10-CM | POA: Diagnosis not present

## 2016-08-05 DIAGNOSIS — L929 Granulomatous disorder of the skin and subcutaneous tissue, unspecified: Secondary | ICD-10-CM | POA: Diagnosis not present

## 2016-08-05 DIAGNOSIS — D485 Neoplasm of uncertain behavior of skin: Secondary | ICD-10-CM | POA: Diagnosis not present

## 2016-08-05 DIAGNOSIS — Z23 Encounter for immunization: Secondary | ICD-10-CM | POA: Diagnosis not present

## 2016-08-27 DIAGNOSIS — Z23 Encounter for immunization: Secondary | ICD-10-CM | POA: Diagnosis not present

## 2016-09-06 ENCOUNTER — Encounter (HOSPITAL_BASED_OUTPATIENT_CLINIC_OR_DEPARTMENT_OTHER): Payer: Self-pay | Admitting: Emergency Medicine

## 2016-09-06 ENCOUNTER — Emergency Department (HOSPITAL_BASED_OUTPATIENT_CLINIC_OR_DEPARTMENT_OTHER)
Admission: EM | Admit: 2016-09-06 | Discharge: 2016-09-06 | Disposition: A | Discharge status: Home / Self Care | Payer: Medicare Other | Attending: Emergency Medicine | Admitting: Emergency Medicine

## 2016-09-06 ENCOUNTER — Emergency Department (HOSPITAL_BASED_OUTPATIENT_CLINIC_OR_DEPARTMENT_OTHER): Payer: Medicare Other

## 2016-09-06 DIAGNOSIS — J40 Bronchitis, not specified as acute or chronic: Secondary | ICD-10-CM | POA: Diagnosis not present

## 2016-09-06 DIAGNOSIS — J4531 Mild persistent asthma with (acute) exacerbation: Secondary | ICD-10-CM | POA: Diagnosis not present

## 2016-09-06 DIAGNOSIS — Z87891 Personal history of nicotine dependence: Secondary | ICD-10-CM | POA: Diagnosis not present

## 2016-09-06 DIAGNOSIS — Z79899 Other long term (current) drug therapy: Secondary | ICD-10-CM | POA: Diagnosis not present

## 2016-09-06 DIAGNOSIS — J45901 Unspecified asthma with (acute) exacerbation: Secondary | ICD-10-CM

## 2016-09-06 DIAGNOSIS — I10 Essential (primary) hypertension: Secondary | ICD-10-CM | POA: Diagnosis not present

## 2016-09-06 DIAGNOSIS — Z7982 Long term (current) use of aspirin: Secondary | ICD-10-CM | POA: Diagnosis not present

## 2016-09-06 DIAGNOSIS — R0602 Shortness of breath: Secondary | ICD-10-CM | POA: Diagnosis not present

## 2016-09-06 DIAGNOSIS — R05 Cough: Secondary | ICD-10-CM | POA: Diagnosis not present

## 2016-09-06 HISTORY — DX: Unspecified asthma, uncomplicated: J45.909

## 2016-09-06 MED ORDER — AZITHROMYCIN 250 MG PO TABS: 250.0000 mg | ORAL_TABLET | Freq: Every day | ORAL | 0 refills | Status: DC

## 2016-09-06 MED ORDER — PREDNISONE 20 MG PO TABS
40.0000 mg | ORAL_TABLET | Freq: Once | ORAL | Status: AC
  Administered 2016-09-06: 40 mg via ORAL
  Filled 2016-09-06: qty 2

## 2016-09-06 MED ORDER — PREDNISONE 20 MG PO TABS: 40.0000 mg | ORAL_TABLET | Freq: Every day | ORAL | 0 refills | Status: DC

## 2016-09-06 MED ORDER — IPRATROPIUM BROMIDE 0.02 % IN SOLN
0.5000 mg | Freq: Once | RESPIRATORY_TRACT | Status: AC
  Administered 2016-09-06: 0.5 mg via RESPIRATORY_TRACT
  Filled 2016-09-06: qty 2.5

## 2016-09-06 MED ORDER — ALBUTEROL SULFATE (2.5 MG/3ML) 0.083% IN NEBU
5.0000 mg | INHALATION_SOLUTION | Freq: Once | RESPIRATORY_TRACT | Status: AC
  Administered 2016-09-06: 5 mg via RESPIRATORY_TRACT
  Filled 2016-09-06: qty 6

## 2016-09-06 MED ORDER — ALBUTEROL SULFATE HFA 108 (90 BASE) MCG/ACT IN AERS: 1.0000 | INHALATION_SPRAY | RESPIRATORY_TRACT | 0 refills | Status: DC | PRN

## 2016-09-06 NOTE — ED Notes (Signed)
Patient denies pain and is resting comfortably.  

## 2016-09-06 NOTE — ED Notes (Signed)
02 not needed at this time

## 2016-09-06 NOTE — ED Triage Notes (Signed)
Per daughter patient "sounds like she has bronchitis".  States between last night and this morning she has had a non productive cough.  States that she becomes short of breath when she is doing anything around the house.

## 2016-09-06 NOTE — ED Provider Notes (Signed)
[facility] Provider Note   CSN: [id number] Arrival date & time: 09/06/16  1303     History   Chief Complaint Chief Complaint  Patient presents with  . Cough  . Shortness of Breath    HPI Kayla Hays is a 75 y.o. female.  Patient is a 75 year old female with a history of asthma, hypertension and dementia who recently moved back to [place] from the head. She has been here approximately 6 weeks in about 1 week ago started to develop nasal congestion and a cough. The cough has progressed to a rattling deep cough which is significantly worse today. No fever or cold-like symptoms. Patient attempted to use her inhaler but unclear if she is using it correctly. She does complain of some minimal shortness of breath but no other significant complaints.   The history is provided by the patient, a relative and a caregiver.  Cough  This is a new problem. Episode onset: Approximately 1 week ago. The problem occurs constantly. The problem has been gradually worsening. The cough is non-productive. There has been no fever. Associated symptoms include rhinorrhea, shortness of breath and wheezing. Pertinent negatives include no chest pain and no chills. Associated symptoms comments: Mild congestion. She has tried decongestants for the symptoms. The treatment provided no relief. She is not a smoker. Her past medical history is significant for asthma.  Shortness of Breath  Associated symptoms include rhinorrhea, cough and wheezing. Pertinent negatives include no chest pain. Associated medical issues include asthma.    Past Medical History:  Diagnosis Date  . Anxiety   . Asthma   . Hypertension   . PVC's (premature ventricular contractions)     Patient Active Problem List   Diagnosis Date Noted  . PVC's (premature ventricular contractions) 05/18/2013  . Essential hypertension 05/18/2013  . Memory loss 05/18/2013    Past Surgical History:  Procedure Laterality Date  .  ABDOMINAL HYSTERECTOMY    . NO PAST SURGERIES      OB History    No data available       Home Medications    Prior to Admission medications   Medication Sig Start Date End Date Taking? Authorizing Provider  aspirin 81 MG tablet Take 81 mg by mouth.     Historical Provider, MD  cetirizine (ZYRTEC) 10 MG tablet Take 10 mg by mouth daily.    Historical Provider, MD  Dietary Management Product (ENLYTE) CAPS Take 1 capsule by mouth daily.    Historical Provider, MD  FLOVENT HFA 44 MCG/ACT inhaler Inhale 1 puff into the lungs as needed (FOR SHORTNESS OF BREATH AND WHEEZING).  05/10/14   Historical Provider, MD  lisinopril (PRINIVIL,ZESTRIL) 5 MG tablet Take 1 tablet (5 mg total) by mouth daily. NEED OV. 02/11/16   Liliane Shi, PA-C  loperamide (IMODIUM A-D) 2 MG tablet TAKE 1/2 TO TABLET DAILY AS NEEDED FOR DIARRHEA    Historical Provider, MD  metoprolol succinate (TOPROL-XL) 50 MG 24 hr tablet Take 1 tablet (50 mg total) by mouth daily. Take with or immediately following a meal. 08/13/15   Lorretta Harp, MD  Multiple Vitamin (MULTIVITAMIN) capsule Take 1 capsule by mouth daily.    Historical Provider, MD    Family History Family History  Problem Relation Age of Onset  . Breast cancer Mother   . Heart disease Maternal Grandfather   . Cancer Paternal Grandfather     Social History Social History  Substance Use Topics  . Smoking status:  Former Smoker    Quit date: 05/18/1988  . Smokeless tobacco: Never Used  . Alcohol use Yes     Comment: occassional     Allergies   Tetanus toxoids; Theophyllines; Penicillins; Rivastigmine tartrate; and Sulfa antibiotics   Review of Systems Review of Systems  Constitutional: Negative for chills.  HENT: Positive for rhinorrhea.   Respiratory: Positive for cough, shortness of breath and wheezing.   Cardiovascular: Negative for chest pain.  All other systems reviewed and are negative.    Physical Exam Updated Vital Signs BP 156/76  (BP Location: Right Arm)   Pulse 73   Temp 97.8 F (36.6 C) (Oral)   Resp 16   Ht 5\' 8"  (1.727 m)   Wt 126 lb 12.8 oz (57.5 kg)   SpO2 99%   BMI 19.28 kg/m   Physical Exam  Constitutional: She is oriented to person, place, and time. She appears well-developed and well-nourished. No distress.  HENT:  Head: Normocephalic and atraumatic.  Mouth/Throat: Oropharynx is clear and moist.  Eyes: Conjunctivae and EOM are normal. Pupils are equal, round, and reactive to light.  Neck: Normal range of motion. Neck supple.  Cardiovascular: Normal rate, regular rhythm and intact distal pulses.   No murmur heard. Pulmonary/Chest: Effort normal. No respiratory distress. She has no decreased breath sounds. She has wheezes. She has rhonchi. She has no rales.  Abdominal: Soft. She exhibits no distension. There is no tenderness. There is no rebound and no guarding.  Musculoskeletal: Normal range of motion. She exhibits no edema or tenderness.  Neurological: She is alert and oriented to person, place, and time.  Skin: Skin is warm and dry. No rash noted. No erythema.  Psychiatric: She has a normal mood and affect. Her behavior is normal.  Nursing note and vitals reviewed.    ED Treatments / Results  Labs (all labs ordered are listed, but only abnormal results are displayed) Labs Reviewed - No data to display  EKG  EKG Interpretation  Date/Time:  Sunday September 06 2016 13:28:53 EST Ventricular Rate:  75 PR Interval:  134 QRS Duration: 90 QT Interval:  402 QTC Calculation: 448 R Axis:   81 Text Interpretation:  Normal sinus rhythm new  Nonspecific ST and T wave abnormality Confirmed by Maryan Rued  MD, Danuel Felicetti ([id number]) on 09/06/2016 1:47:04 PM       Radiology Dg Chest 2 View  Result Date: 09/06/2016 CLINICAL DATA:  Cough and shortness of breath EXAM: CHEST  2 VIEW COMPARISON:  None. FINDINGS: Lungs are mildly hyperexpanded but clear. Heart size and pulmonary vascularity are normal. No  adenopathy. No bone lesions. IMPRESSION: No edema or consolidation. Electronically Signed   By: Lowella Grip III M.D.   On: 09/06/2016 13:52    Procedures Procedures (including critical care time)  Medications Ordered in ED Medications  albuterol (PROVENTIL) (2.5 MG/3ML) 0.083% nebulizer solution 5 mg (5 mg Nebulization Given 09/06/16 1356)  predniSONE (DELTASONE) tablet 40 mg (40 mg Oral Given 09/06/16 1407)  albuterol (PROVENTIL) (2.5 MG/3ML) 0.083% nebulizer solution 5 mg (5 mg Nebulization Given 09/06/16 1426)  ipratropium (ATROVENT) nebulizer solution 0.5 mg (0.5 mg Nebulization Given 09/06/16 1426)     Initial Impression / Assessment and Plan / ED Course  I have reviewed the triage vital signs and the nursing notes.  Pertinent labs & imaging results that were available during my care of the patient were reviewed by me and considered in my medical decision making (see chart for details).  Clinical Course  Patient is a 75 year old female presenting today with cough, congestion and worsening wheezing. She denies any chest pain.  EKG shows nonspecific ST changes but no evidence of ST elevation. X-ray is clear and no exam findings concerning for CHF. Patient seems to be having asthma exacerbation which may be triggered by allergies. Recently moved back here in the last 6 weeks from [facility] head. Had not used an inhaler for years and unclear if using it correctly. Chest x-ray without any signs of pneumonia or fluid overload. Will treat with prednisone and albuterol and Atrovent. Will recheck.  3:14 PM Wheezing significantly improved. Patient no acute distress. Treated with azithromycin, prednisone and albuterol. Final Clinical Impressions(s) / ED Diagnoses   Final diagnoses:  Exacerbation of persistent asthma, unspecified asthma severity  Bronchitis    New Prescriptions New Prescriptions   ALBUTEROL (PROVENTIL HFA;VENTOLIN HFA) 108 (90 BASE) MCG/ACT INHALER    Inhale 1-2  puffs into the lungs every 4 (four) hours as needed for wheezing or shortness of breath.   AZITHROMYCIN (ZITHROMAX) 250 MG TABLET    Take 1 tablet (250 mg total) by mouth daily. Take first 2 tablets together, then 1 every day until finished.   PREDNISONE (DELTASONE) 20 MG TABLET    Take 2 tablets (40 mg total) by mouth daily.     Blanchie Dessert, MD 09/06/16 [phone]

## 2016-09-09 DIAGNOSIS — J4521 Mild intermittent asthma with (acute) exacerbation: Secondary | ICD-10-CM | POA: Diagnosis not present

## 2016-09-21 DIAGNOSIS — J4521 Mild intermittent asthma with (acute) exacerbation: Secondary | ICD-10-CM | POA: Diagnosis not present

## 2016-09-21 DIAGNOSIS — F3341 Major depressive disorder, recurrent, in partial remission: Secondary | ICD-10-CM | POA: Diagnosis not present

## 2016-10-21 DIAGNOSIS — R0602 Shortness of breath: Secondary | ICD-10-CM | POA: Diagnosis not present

## 2016-10-21 DIAGNOSIS — F3341 Major depressive disorder, recurrent, in partial remission: Secondary | ICD-10-CM | POA: Diagnosis not present

## 2016-10-21 DIAGNOSIS — Z9181 History of falling: Secondary | ICD-10-CM | POA: Diagnosis not present

## 2016-10-26 ENCOUNTER — Encounter: Payer: Self-pay | Admitting: Physical Therapy

## 2016-10-26 ENCOUNTER — Ambulatory Visit: Payer: Medicare Other | Attending: Family Medicine | Admitting: Physical Therapy

## 2016-10-26 DIAGNOSIS — R262 Difficulty in walking, not elsewhere classified: Secondary | ICD-10-CM | POA: Diagnosis not present

## 2016-10-26 DIAGNOSIS — M6281 Muscle weakness (generalized): Secondary | ICD-10-CM | POA: Diagnosis not present

## 2016-10-26 NOTE — Therapy (Signed)
[place], [place], [address] Phone: [phone]   Fax:  [phone]  Physical Therapy Evaluation  Patient Details  Name: Kayla Hays MRN: [id number] Date of Birth: 10-22-1940 Referring Provider: Dr. Leonides Schanz  Encounter Date: 10/26/2016      PT End of Session - 10/26/16 1502    Visit Number 1   Date for PT Re-Evaluation 12/21/16   PT Start Time 0200   PT Stop Time 0250   PT Time Calculation (min) 50 min   Activity Tolerance Other (comment)  pt tolerated forward movements well however high level balance exercises did not go as well   Behavior During Therapy [facility] for tasks assessed/performed      Past Medical History:  Diagnosis Date  . Anxiety   . Asthma   . Hypertension   . PVC's (premature ventricular contractions)     Past Surgical History:  Procedure Laterality Date  . ABDOMINAL HYSTERECTOMY    . NO PAST SURGERIES      There were no vitals filed for this visit.       Subjective Assessment - 10/26/16 1408    Subjective Pt presents with some balance issues and fall risk. She states that this began a couple of weeks ago and she reports feeling dizzy. She states that it happened for two days and has not happened since. She can not remember doing anything out of the ordinary leading up to those days. Daughter states that a week ago, her mom told her that she was afraid she would fall.    Pertinent History Diagnosed with Alzheimers 4 years ago, Breast cancer survivor (2000), Asthma   Patient Stated Goals fall prevention   Currently in Pain? No/denies            [facility] PT Assessment - 10/26/16 0001      Assessment   Medical Diagnosis fall risk, balance disorder   Referring Provider Dr. Leonides Schanz   Prior Therapy yes     Balance Screen   Has the patient fallen in the past 6 months No   Has the patient had a decrease in activity level because of a fear of falling?  Yes   Is the  patient reluctant to leave their home because of a fear of falling?  No  afraid to exercise     Home Environment   Living Environment Private residence   Living Arrangements Alone;Children;Non-relatives/Friends   Home Access Stairs to enter   Entrance Stairs-Number of Steps 2   Additional Comments 12 steps      Prior Function   Level of Independence Independent   Vocation Retired   Leisure gardening     ROM / Strength   AROM / PROM / Strength Strength     Strength   Overall Strength Comments 10 toe raises, holding onto PT hand   Strength Assessment Site Hip;Knee;Ankle   Right/Left Hip Right;Left   Right Hip Flexion 4/5   Right Hip ABduction 4+/5   Right Hip ADduction 4+/5   Left Hip Flexion 4/5   Left Hip ABduction 4+/5   Left Hip ADduction 4+/5   Right/Left Knee Right;Left   Right Knee Flexion 5/5   Right Knee Extension 5/5   Left Knee Flexion 5/5   Left Knee Extension 5/5   Right/Left Ankle Right;Left   Right Ankle Dorsiflexion 5/5   Right Ankle Plantar Flexion 5/5   Left Ankle Dorsiflexion 5/5   Left Ankle Plantar  Flexion 5/5     Flexibility   Soft Tissue Assessment /Muscle Length yes   Hamstrings 90/90: lacking 40 degrees bilaterally   ITB tight  pt voiced major discomfort   Piriformis tight  pt voiced major discomfort     Balance   Balance Assessed --     High Level Balance   High Level Balance Comments 4 square step test: discontinued due to pt verbal report that she did not think she could complete. Cognitively, test proved very difficult and pt kicked the crutches several times during trial run with lateral side steps and backward step     Standardized Balance Assessment   Standardized Balance Assessment Berg Balance Test;Timed Up and Go Test;Five Times Sit to Stand   Five times sit to stand comments  9 secs     Berg Balance Test   Sit to Stand Able to stand without using hands and stabilize independently   Standing Unsupported Able to stand safely 2  minutes   Sitting with Back Unsupported but Feet Supported on Floor or Stool Able to sit safely and securely 2 minutes   Stand to Sit Sits safely with minimal use of hands   Transfers Able to transfer safely, minor use of hands   Standing Unsupported with Eyes Closed Able to stand 10 seconds safely  "hard to do" Pt voiced fear that she might have to take step   Standing Ubsupported with Feet Together Able to place feet together independently and stand 1 minute safely   From Standing, Reach Forward with Outstretched Arm Can reach confidently >25 cm (10")   From Standing Position, Pick up Object from Floor Able to pick up shoe safely and easily   From Standing Position, Turn to Look Behind Over each Shoulder Looks behind from both sides and weight shifts well   Turn 360 Degrees Able to turn 360 degrees safely in 4 seconds or less   Standing Unsupported, Alternately Place Feet on Step/Stool Able to complete 4 steps without aid or supervision  kicked step twice   Standing Unsupported, One Foot in Front Able to take small step independently and hold 30 seconds   Standing on One Leg Able to lift leg independently and hold equal to or more than 3 seconds   Total Score 50   Berg comment: High level balance proved very difficult, standing on one leg, tandem stance, toe taps on 4 inch step     Timed Up and Go Test   TUG Comments not assessed                           PT Education - 10/26/16 1459    Education provided Yes   Education Details Began to talk to pt about safety awareness and slowing down with activities at home.    Person(s) Educated Patient;Child(ren);Caregiver(s)   Methods Explanation   Comprehension Need further instruction;Verbalized understanding          PT Short Term Goals - 10/26/16 1510      PT SHORT TERM GOAL #1   Title Pt will be independent with HEP.   Time 1   Period Weeks   Status New           PT Long Term Goals - 10/26/16 1511       PT LONG TERM GOAL #1   Title Pt will improve HS 90/90 by 20 degrees in order tro improve flexibility.   Time 8  Period Weeks   Status New     PT LONG TERM GOAL #2   Title Pt will increase hip flexion strength to 4+/5 and be able to perform 10 toe raises without assistance.   Time 8   Period Weeks   Status New     PT LONG TERM GOAL #3   Title Pt will demonstrate adequate safety awareness with obstacle course involving stepping over objects and receive no verbal cueing with stand by assist.   Time 8   Period Weeks   Status New     PT LONG TERM GOAL #4   Title Pt will report no falls at home or in community environment.   Time 8   Period Weeks   Status New               Plan - 10/26/16 1503    Clinical Impression Statement Pt presents with some balance deficits that are seen predominantly with lateral side steps and backward stepping. Pt demonstrtaes decreased safety awareness with stepping onto/over objects that are on the ground. Pt was able to score well on the Berg balance test however alternate toe taps, tandem stance, and stanidng on one leg proved to be very dfficult. Pt voiced feeling like she was unsteady. Pt alo has decreased flexibility of the LEs and decreased strength of the hip flexors. Functionally, pt was unable to perform toe raises without one hand hold assistance. Four square step test was discontinued because she could not cognitively understand the exercise and she tripped over the crutches several times whn side stepping and stepping backward.    Kayla Potential Good   PT Frequency 2x / week   PT Duration 4 weeks   PT Treatment/Interventions Therapeutic activities;Therapeutic exercise;Balance training;Neuromuscular re-education;Patient/family education;Passive range of motion;Manual techniques;Vestibular;Visual/perceptual remediation/compensation;Energy conservation;Moist Heat;Cryotherapy;Gait training;Stair training;Functional mobility training;ADLs/Self  Care Home Management   PT Next Visit Plan High level balance, aerobic exercise, manual stretches, stair training, HEP for LE stretches   PT Home Exercise Plan give next visit   Consulted and Agree with Plan of Care Patient;Family member/caregiver      Patient will benefit from skilled therapeutic intervention in order to improve the following deficits and impairments:  Decreased balance, Decreased strength, Hypomobility, Decreased safety awareness, Decreased range of motion, Dizziness, Impaired flexibility, Improper body mechanics, Decreased activity tolerance, Decreased mobility  Visit Diagnosis: Difficulty in walking, not elsewhere classified  Muscle weakness (generalized)     Problem List Patient Active Problem List   Diagnosis Date Noted  . PVC's (premature ventricular contractions) 05/18/2013  . Essential hypertension 05/18/2013  . Memory loss 05/18/2013    Toy Baker, SPT 10/26/2016, 3:20 PM  [place] [address] [place] [place], [place], [address] Phone: [phone]   Fax:  [phone]  Name: Kayla Hays MRN: [id number] Date of Birth: 05/21/1941

## 2016-10-28 ENCOUNTER — Ambulatory Visit: Payer: Medicare Other | Admitting: Physical Therapy

## 2016-10-28 ENCOUNTER — Encounter: Payer: Self-pay | Admitting: Physical Therapy

## 2016-10-28 DIAGNOSIS — R262 Difficulty in walking, not elsewhere classified: Secondary | ICD-10-CM

## 2016-10-28 DIAGNOSIS — M6281 Muscle weakness (generalized): Secondary | ICD-10-CM | POA: Diagnosis not present

## 2016-10-28 NOTE — Therapy (Signed)
[place], [place], [address] Phone: [phone]   Fax:  [phone]  Physical Therapy Treatment  Patient Details  Name: KYNDLE MEGGITT MRN: [id number] Date of Birth: [birth year] Referring Provider: Dr. Leonides Schanz  Encounter Date: 10/28/2016      PT End of Session - 10/28/16 1241    Visit Number 2   Date for PT Re-Evaluation 12/21/16   PT Start Time 1145   PT Stop Time 1230   PT Time Calculation (min) 45 min   Activity Tolerance Patient tolerated treatment well   Behavior During Therapy [facility] for tasks assessed/performed      Past Medical History:  Diagnosis Date  . Anxiety   . Asthma   . Hypertension   . PVC's (premature ventricular contractions)     Past Surgical History:  Procedure Laterality Date  . ABDOMINAL HYSTERECTOMY    . NO PAST SURGERIES      There were no vitals filed for this visit.      Subjective Assessment - 10/28/16 1146    Subjective "Im ok"   Currently in Pain? No/denies                         [facility] Adult PT Treatment/Exercise - 10/28/16 0001      High Level Balance   High Level Balance Activities Marching forwards   High Level Balance Comments tandem stance 3 x 30", SLS 3 x 30"     Exercises   Exercises Knee/Hip;Lumbar     Lumbar Exercises: Stretches   ITB Stretch 30 seconds;2 reps   Piriformis Stretch 2 reps;30 seconds     Knee/Hip Exercises: Aerobic   Nustep lvl 4 x 6 mins     Knee/Hip Exercises: Standing   Hip ADduction Both;1 set;20 reps   Hip Abduction Both;1 set;20 reps     Knee/Hip Exercises: Seated   Sit to Sand 10 reps;without UE support                  PT Short Term Goals - 10/26/16 1510      PT SHORT TERM GOAL #1   Title Pt will be independent with HEP.   Time 1   Period Weeks   Status New           PT Long Term Goals - 10/26/16 1511      PT LONG TERM GOAL #1   Title Pt will improve HS 90/90  by 20 degrees in order tro improve flexibility.   Time 8   Period Weeks   Status New     PT LONG TERM GOAL #2   Title Pt will increase hip flexion strength to 4+/5 and be able to perform 10 toe raises without assistance.   Time 8   Period Weeks   Status New     PT LONG TERM GOAL #3   Title Pt will demonstrate adequate safety awareness with obstacle course involving stepping over objects and receive no verbal cueing with stand by assist.   Time 8   Period Weeks   Status New     PT LONG TERM GOAL #4   Title Pt will report no falls at home or in community environment.   Time 8   Period Weeks   Status New               Plan - 10/28/16 1242    Clinical Impression Statement  Pt. vocally didn't want to be in therapy but attitude warmed up as PT progressed. Patient verbally stated the stretching felt good and she liked it. With all exercises she needed max VC on keeping her head up and keeping good posture. During standing add/abd she needed max VC on keeping knee straight. During sit to stands she pushed against plinth to stand, so that needs to be addressed next session and her knees also adducted during so a red Tband was added around the knees.    Rehab Potential Good   PT Frequency 2x / week   PT Duration 4 weeks   PT Treatment/Interventions Therapeutic activities;Therapeutic exercise;Balance training;Neuromuscular re-education;Patient/family education;Passive range of motion;Manual techniques;Vestibular;Visual/perceptual remediation/compensation;Energy conservation;Moist Heat;Cryotherapy;Gait training;Stair training;Functional mobility training;ADLs/Self Care Home Management   PT Next Visit Plan High level balance, aerobic exercise, manual stretches, stair training, HEP for LE stretches      Patient will benefit from skilled therapeutic intervention in order to improve the following deficits and impairments:  Decreased balance, Decreased strength, Hypomobility, Decreased safety  awareness, Decreased range of motion, Dizziness, Impaired flexibility, Improper body mechanics, Decreased activity tolerance, Decreased mobility  Visit Diagnosis: Muscle weakness (generalized)  Difficulty in walking, not elsewhere classified     Problem List Patient Active Problem List   Diagnosis Date Noted  . PVC's (premature ventricular contractions) 05/18/2013  . Essential hypertension 05/18/2013  . Memory loss 05/18/2013    Scot Jun, PTA 10/28/2016, 12:51 PM  [place] [address] [place], [place], [address] Phone: [phone]   Fax:  [phone]  Name: TAZIYAH KOPLIN MRN: [id number] Date of Birth: 1941/01/11

## 2016-10-30 DIAGNOSIS — R350 Frequency of micturition: Secondary | ICD-10-CM | POA: Diagnosis not present

## 2016-10-30 DIAGNOSIS — E348 Other specified endocrine disorders: Secondary | ICD-10-CM | POA: Diagnosis not present

## 2016-11-03 ENCOUNTER — Ambulatory Visit: Payer: Medicare Other | Admitting: Physical Therapy

## 2016-11-03 ENCOUNTER — Encounter: Payer: Self-pay | Admitting: Physical Therapy

## 2016-11-03 DIAGNOSIS — R262 Difficulty in walking, not elsewhere classified: Secondary | ICD-10-CM | POA: Diagnosis not present

## 2016-11-03 DIAGNOSIS — M6281 Muscle weakness (generalized): Secondary | ICD-10-CM

## 2016-11-03 NOTE — Therapy (Signed)
[place], [place], [address] Phone: [phone]   Fax:  [phone]  Physical Therapy Treatment  Patient Details  Name: Kayla Hays MRN: [id number] Date of Birth: 20-Oct-1940 Referring Provider: Dr. Leonides Schanz  Encounter Date: 11/03/2016      PT End of Session - 11/03/16 1428    Visit Number 3   Date for PT Re-Evaluation 12/21/16   PT Start Time 1345   PT Stop Time 1428   PT Time Calculation (min) 43 min   Activity Tolerance Patient tolerated treatment well   Behavior During Therapy [facility] for tasks assessed/performed      Past Medical History:  Diagnosis Date  . Anxiety   . Asthma   . Hypertension   . PVC's (premature ventricular contractions)     Past Surgical History:  Procedure Laterality Date  . ABDOMINAL HYSTERECTOMY    . NO PAST SURGERIES      There were no vitals filed for this visit.      Subjective Assessment - 11/03/16 1354    Subjective "We have been doing fair, a lot of crying"   Currently in Pain? No/denies                         [facility] Adult PT Treatment/Exercise - 11/03/16 0001      High Level Balance   High Level Balance Activities Side stepping;Negotiating over obstacles;Marching forwards;Backward walking   High Level Balance Comments Standing on airex with ball reaches, standing on airex and marchn     Lumbar Exercises: Seated   Sit to Stand 10 reps     Knee/Hip Exercises: Aerobic   Nustep lvl 4 x 6 mins                  PT Short Term Goals - 10/26/16 1510      PT SHORT TERM GOAL #1   Title Pt will be independent with HEP.   Time 1   Period Weeks   Status New           PT Long Term Goals - 11/03/16 1430      PT LONG TERM GOAL #1   Title Pt will improve HS 90/90 by 20 degrees in order tro improve flexibility.   Status On-going     PT LONG TERM GOAL #2   Title Pt will increase hip flexion strength to 4+/5 and be  able to perform 10 toe raises without assistance.   Status On-going     PT LONG TERM GOAL #3   Title Pt will demonstrate adequate safety awareness with obstacle course involving stepping over objects and receive no verbal cueing with stand by assist.   Status Partially Met     PT LONG TERM GOAL #4   Title Pt will report no falls at home or in community environment.   Status On-going               Plan - 11/03/16 1428    Clinical Impression Statement Pt with some difficulty understanding instructions to perform each exercises. Pt requires CGA to min assist to perform all standing interventions. Constant cues required to perform sit to stand without letting the back of legs touch the mat table/.   Rehab Potential Good   PT Frequency 2x / week   PT Duration 4 weeks   PT Treatment/Interventions Therapeutic activities;Therapeutic exercise;Balance training;Neuromuscular re-education;Patient/family education;Passive range of motion;Manual techniques;Vestibular;Visual/perceptual  remediation/compensation;Energy conservation;Moist Heat;Cryotherapy;Gait training;Stair training;Functional mobility training;ADLs/Self Care Home Management   PT Next Visit Plan High level balance, aerobic exercise, manual stretches, stair training, HEP for LE stretches      Patient will benefit from skilled therapeutic intervention in order to improve the following deficits and impairments:  Decreased balance, Decreased strength, Hypomobility, Decreased safety awareness, Decreased range of motion, Dizziness, Impaired flexibility, Improper body mechanics, Decreased activity tolerance, Decreased mobility  Visit Diagnosis: Difficulty in walking, not elsewhere classified  Muscle weakness (generalized)     Problem List Patient Active Problem List   Diagnosis Date Noted  . PVC's (premature ventricular contractions) 05/18/2013  . Essential hypertension 05/18/2013  . Memory loss 05/18/2013    Scot Jun 11/03/2016, 2:31 PM  [place] [address] [place], [place], [address] Phone: [phone]   Fax:  [phone]  Name: Kayla Hays MRN: [id number] Date of Birth: 04/17/1941

## 2016-11-09 ENCOUNTER — Ambulatory Visit: Payer: Medicare Other | Admitting: Physical Therapy

## 2016-11-09 ENCOUNTER — Encounter: Payer: Self-pay | Admitting: Physical Therapy

## 2016-11-09 DIAGNOSIS — M6281 Muscle weakness (generalized): Secondary | ICD-10-CM

## 2016-11-09 DIAGNOSIS — R262 Difficulty in walking, not elsewhere classified: Secondary | ICD-10-CM | POA: Diagnosis not present

## 2016-11-09 NOTE — Therapy (Addendum)
[place], [place], [address] Phone: [phone]   Fax:  [phone]  Physical Therapy Treatment  Patient Details  Name: MELBA ARAKI MRN: [id number] Date of Birth: 02/11/1941 Referring Provider: Dr. Leonides Schanz  Encounter Date: 11/09/2016      PT End of Session - 11/09/16 1337    Visit Number 4   Date for PT Re-Evaluation 12/21/16   PT Start Time 1307   PT Stop Time 1337   PT Time Calculation (min) 30 min   Activity Tolerance Patient tolerated treatment well;Patient limited by fatigue   Behavior During Therapy [facility] for tasks assessed/performed      Past Medical History:  Diagnosis Date  . Anxiety   . Asthma   . Hypertension   . PVC's (premature ventricular contractions)     Past Surgical History:  Procedure Laterality Date  . ABDOMINAL HYSTERECTOMY    . NO PAST SURGERIES      There were no vitals filed for this visit.      Subjective Assessment - 11/09/16 1311    Subjective Feeling fair. Just ate lunch so activity makes her feel as tho she cannot breath well and also feels sick.                          [facility] Adult PT Treatment/Exercise - 11/09/16 0001      High Level Balance   High Level Balance Activities Side stepping;Marching forwards  with red weighted ball     Lumbar Exercises: Aerobic   Stationary Bike Nustep lvl6 43mns     Knee/Hip Exercises: Standing   Other Standing Knee Exercises Sit to stands 2x100 with red weighted ball                  PT Short Term Goals - 10/26/16 1510      PT SHORT TERM GOAL #1   Title Pt will be independent with HEP.   Time 1   Period Weeks   Status New           PT Long Term Goals - 11/03/16 1430      PT LONG TERM GOAL #1   Title Pt will improve HS 90/90 by 20 degrees in order tro improve flexibility.   Status On-going     PT LONG TERM GOAL #2   Title Pt will increase hip flexion strength to  4+/5 and be able to perform 10 toe raises without assistance.   Status On-going     PT LONG TERM GOAL #3   Title Pt will demonstrate adequate safety awareness with obstacle course involving stepping over objects and receive no verbal cueing with stand by assist.   Status Partially Met     PT LONG TERM GOAL #4   Title Pt will report no falls at home or in community environment.   Status On-going               Plan - 11/09/16 1338    Clinical Impression Statement Patient was limited today by her asthma, she had to take frequent breaks. She needed mod. VC on staying on task and correct technique. Knees adducted during sit to stands and patient needed a verbal and tactile cue. She kept side stepping at a diagonal instead of a straight line.   Rehab Potential Good   PT Frequency 2x / week   PT Duration 4 weeks   PT  Treatment/Interventions Therapeutic activities;Therapeutic exercise;Balance training;Neuromuscular re-education;Patient/family education;Passive range of motion;Manual techniques;Vestibular;Visual/perceptual remediation/compensation;Energy conservation;Moist Heat;Cryotherapy;Gait training;Stair training;Functional mobility training;ADLs/Self Care Home Management   PT Next Visit Plan High level balance, aerobic exercise, manual stretches, stair training, HEP for LE stretches   Consulted and Agree with Plan of Care Patient;Family member/caregiver      Patient will benefit from skilled therapeutic intervention in order to improve the following deficits and impairments:  Decreased balance, Decreased strength, Hypomobility, Decreased safety awareness, Decreased range of motion, Dizziness, Impaired flexibility, Improper body mechanics, Decreased activity tolerance, Decreased mobility  Visit Diagnosis: Difficulty in walking, not elsewhere classified  Muscle weakness (generalized)     Problem List Patient Active Problem List   Diagnosis Date Noted  . PVC's (premature  ventricular contractions) 05/18/2013  . Essential hypertension 05/18/2013  . Memory loss 05/18/2013   PHYSICAL THERAPY DISCHARGE SUMMARY  Visits from Start of Care:4   Plan: Patient agrees to discharge.  Patient goals were partially met. Patient is being discharged due to being pleased with the current functional level.  ?????     Devoria Albe, [place] 11/09/2016, 1:43 PM  [place] [address] [place], [place], [address] Phone: [phone]   Fax:  [phone]  Name: LELER BRION MRN: [id number] Date of Birth: 19-May-1941

## 2016-11-23 DIAGNOSIS — J45909 Unspecified asthma, uncomplicated: Secondary | ICD-10-CM | POA: Diagnosis not present

## 2016-11-23 DIAGNOSIS — R0602 Shortness of breath: Secondary | ICD-10-CM | POA: Diagnosis not present

## 2016-11-23 DIAGNOSIS — R05 Cough: Secondary | ICD-10-CM | POA: Diagnosis not present

## 2016-12-24 DIAGNOSIS — Z1231 Encounter for screening mammogram for malignant neoplasm of breast: Secondary | ICD-10-CM | POA: Diagnosis not present

## 2016-12-24 DIAGNOSIS — Z01419 Encounter for gynecological examination (general) (routine) without abnormal findings: Secondary | ICD-10-CM | POA: Diagnosis not present

## 2016-12-24 DIAGNOSIS — Z6820 Body mass index (BMI) 20.0-20.9, adult: Secondary | ICD-10-CM | POA: Diagnosis not present

## 2016-12-24 DIAGNOSIS — Z780 Asymptomatic menopausal state: Secondary | ICD-10-CM | POA: Diagnosis not present

## 2017-01-21 DIAGNOSIS — I10 Essential (primary) hypertension: Secondary | ICD-10-CM | POA: Diagnosis present

## 2017-01-21 DIAGNOSIS — G0490 Encephalitis and encephalomyelitis, unspecified: Secondary | ICD-10-CM | POA: Diagnosis not present

## 2017-01-21 DIAGNOSIS — M6281 Muscle weakness (generalized): Secondary | ICD-10-CM | POA: Diagnosis not present

## 2017-01-21 DIAGNOSIS — R1310 Dysphagia, unspecified: Secondary | ICD-10-CM | POA: Diagnosis present

## 2017-01-21 DIAGNOSIS — G319 Degenerative disease of nervous system, unspecified: Secondary | ICD-10-CM | POA: Diagnosis present

## 2017-01-21 DIAGNOSIS — R52 Pain, unspecified: Secondary | ICD-10-CM | POA: Diagnosis not present

## 2017-01-21 DIAGNOSIS — R1311 Dysphagia, oral phase: Secondary | ICD-10-CM | POA: Diagnosis not present

## 2017-01-21 DIAGNOSIS — F329 Major depressive disorder, single episode, unspecified: Secondary | ICD-10-CM | POA: Diagnosis present

## 2017-01-21 DIAGNOSIS — J4550 Severe persistent asthma, uncomplicated: Secondary | ICD-10-CM | POA: Diagnosis present

## 2017-01-21 DIAGNOSIS — R4182 Altered mental status, unspecified: Secondary | ICD-10-CM | POA: Diagnosis not present

## 2017-01-21 DIAGNOSIS — G9340 Encephalopathy, unspecified: Secondary | ICD-10-CM | POA: Diagnosis not present

## 2017-01-21 DIAGNOSIS — I629 Nontraumatic intracranial hemorrhage, unspecified: Secondary | ICD-10-CM | POA: Diagnosis not present

## 2017-01-21 DIAGNOSIS — D649 Anemia, unspecified: Secondary | ICD-10-CM | POA: Diagnosis present

## 2017-01-21 DIAGNOSIS — R32 Unspecified urinary incontinence: Secondary | ICD-10-CM | POA: Diagnosis present

## 2017-01-21 DIAGNOSIS — R402441 Other coma, without documented Glasgow coma scale score, or with partial score reported, in the field [EMT or ambulance]: Secondary | ICD-10-CM | POA: Diagnosis not present

## 2017-01-21 DIAGNOSIS — Z7982 Long term (current) use of aspirin: Secondary | ICD-10-CM | POA: Diagnosis not present

## 2017-01-21 DIAGNOSIS — I639 Cerebral infarction, unspecified: Secondary | ICD-10-CM | POA: Diagnosis not present

## 2017-01-21 DIAGNOSIS — R41841 Cognitive communication deficit: Secondary | ICD-10-CM | POA: Diagnosis not present

## 2017-01-21 DIAGNOSIS — Z803 Family history of malignant neoplasm of breast: Secondary | ICD-10-CM | POA: Diagnosis not present

## 2017-01-21 DIAGNOSIS — Z853 Personal history of malignant neoplasm of breast: Secondary | ICD-10-CM | POA: Diagnosis not present

## 2017-01-21 DIAGNOSIS — F0280 Dementia in other diseases classified elsewhere without behavioral disturbance: Secondary | ICD-10-CM | POA: Diagnosis not present

## 2017-01-21 DIAGNOSIS — G939 Disorder of brain, unspecified: Secondary | ICD-10-CM | POA: Diagnosis not present

## 2017-01-21 DIAGNOSIS — R279 Unspecified lack of coordination: Secondary | ICD-10-CM | POA: Diagnosis not present

## 2017-01-21 DIAGNOSIS — Z79890 Hormone replacement therapy (postmenopausal): Secondary | ICD-10-CM | POA: Diagnosis not present

## 2017-01-21 DIAGNOSIS — I608 Other nontraumatic subarachnoid hemorrhage: Secondary | ICD-10-CM | POA: Diagnosis not present

## 2017-01-21 DIAGNOSIS — G936 Cerebral edema: Secondary | ICD-10-CM | POA: Diagnosis present

## 2017-01-21 DIAGNOSIS — I619 Nontraumatic intracerebral hemorrhage, unspecified: Secondary | ICD-10-CM | POA: Diagnosis not present

## 2017-01-21 DIAGNOSIS — R2681 Unsteadiness on feet: Secondary | ICD-10-CM | POA: Diagnosis not present

## 2017-01-21 DIAGNOSIS — Z880 Allergy status to penicillin: Secondary | ICD-10-CM | POA: Diagnosis not present

## 2017-01-21 DIAGNOSIS — G301 Alzheimer's disease with late onset: Secondary | ICD-10-CM | POA: Diagnosis not present

## 2017-01-21 DIAGNOSIS — R531 Weakness: Secondary | ICD-10-CM | POA: Diagnosis present

## 2017-01-21 DIAGNOSIS — G309 Alzheimer's disease, unspecified: Secondary | ICD-10-CM | POA: Diagnosis not present

## 2017-01-21 DIAGNOSIS — Z882 Allergy status to sulfonamides status: Secondary | ICD-10-CM | POA: Diagnosis not present

## 2017-01-21 DIAGNOSIS — F419 Anxiety disorder, unspecified: Secondary | ICD-10-CM | POA: Diagnosis present

## 2017-01-21 DIAGNOSIS — Z8249 Family history of ischemic heart disease and other diseases of the circulatory system: Secondary | ICD-10-CM | POA: Diagnosis not present

## 2017-01-21 DIAGNOSIS — I611 Nontraumatic intracerebral hemorrhage in hemisphere, cortical: Secondary | ICD-10-CM | POA: Diagnosis not present

## 2017-01-21 DIAGNOSIS — Z6821 Body mass index (BMI) 21.0-21.9, adult: Secondary | ICD-10-CM | POA: Diagnosis not present

## 2017-01-21 DIAGNOSIS — F0390 Unspecified dementia without behavioral disturbance: Secondary | ICD-10-CM | POA: Diagnosis not present

## 2017-01-21 DIAGNOSIS — I609 Nontraumatic subarachnoid hemorrhage, unspecified: Secondary | ICD-10-CM | POA: Diagnosis not present

## 2017-01-21 DIAGNOSIS — Z6823 Body mass index (BMI) 23.0-23.9, adult: Secondary | ICD-10-CM | POA: Diagnosis not present

## 2017-01-21 DIAGNOSIS — K5900 Constipation, unspecified: Secondary | ICD-10-CM | POA: Diagnosis not present

## 2017-01-21 DIAGNOSIS — Z87891 Personal history of nicotine dependence: Secondary | ICD-10-CM | POA: Diagnosis not present

## 2017-01-21 DIAGNOSIS — Z780 Asymptomatic menopausal state: Secondary | ICD-10-CM | POA: Diagnosis not present

## 2017-01-21 DIAGNOSIS — E569 Vitamin deficiency, unspecified: Secondary | ICD-10-CM | POA: Diagnosis not present

## 2017-01-26 DIAGNOSIS — G309 Alzheimer's disease, unspecified: Secondary | ICD-10-CM | POA: Diagnosis not present

## 2017-01-26 DIAGNOSIS — G0490 Encephalitis and encephalomyelitis, unspecified: Secondary | ICD-10-CM | POA: Diagnosis not present

## 2017-01-26 DIAGNOSIS — I619 Nontraumatic intracerebral hemorrhage, unspecified: Secondary | ICD-10-CM | POA: Diagnosis not present

## 2017-01-26 DIAGNOSIS — F329 Major depressive disorder, single episode, unspecified: Secondary | ICD-10-CM | POA: Diagnosis not present

## 2017-01-26 DIAGNOSIS — Z8659 Personal history of other mental and behavioral disorders: Secondary | ICD-10-CM | POA: Diagnosis not present

## 2017-01-26 DIAGNOSIS — Z8673 Personal history of transient ischemic attack (TIA), and cerebral infarction without residual deficits: Secondary | ICD-10-CM | POA: Diagnosis not present

## 2017-01-26 DIAGNOSIS — Z79899 Other long term (current) drug therapy: Secondary | ICD-10-CM | POA: Diagnosis not present

## 2017-01-26 DIAGNOSIS — S01511A Laceration without foreign body of lip, initial encounter: Secondary | ICD-10-CM | POA: Diagnosis not present

## 2017-01-26 DIAGNOSIS — R52 Pain, unspecified: Secondary | ICD-10-CM | POA: Diagnosis not present

## 2017-01-26 DIAGNOSIS — R41841 Cognitive communication deficit: Secondary | ICD-10-CM | POA: Diagnosis not present

## 2017-01-26 DIAGNOSIS — R5383 Other fatigue: Secondary | ICD-10-CM | POA: Diagnosis not present

## 2017-01-26 DIAGNOSIS — G936 Cerebral edema: Secondary | ICD-10-CM | POA: Diagnosis not present

## 2017-01-26 DIAGNOSIS — G9340 Encephalopathy, unspecified: Secondary | ICD-10-CM | POA: Diagnosis not present

## 2017-01-26 DIAGNOSIS — G301 Alzheimer's disease with late onset: Secondary | ICD-10-CM | POA: Diagnosis not present

## 2017-01-26 DIAGNOSIS — Y92002 Bathroom of unspecified non-institutional (private) residence single-family (private) house as the place of occurrence of the external cause: Secondary | ICD-10-CM | POA: Diagnosis not present

## 2017-01-26 DIAGNOSIS — W182XXA Fall in (into) shower or empty bathtub, initial encounter: Secondary | ICD-10-CM | POA: Diagnosis not present

## 2017-01-26 DIAGNOSIS — Y999 Unspecified external cause status: Secondary | ICD-10-CM | POA: Diagnosis not present

## 2017-01-26 DIAGNOSIS — Y9389 Activity, other specified: Secondary | ICD-10-CM | POA: Diagnosis not present

## 2017-01-26 DIAGNOSIS — Z87891 Personal history of nicotine dependence: Secondary | ICD-10-CM | POA: Diagnosis not present

## 2017-01-26 DIAGNOSIS — Z6823 Body mass index (BMI) 23.0-23.9, adult: Secondary | ICD-10-CM | POA: Diagnosis not present

## 2017-01-26 DIAGNOSIS — R402411 Glasgow coma scale score 13-15, in the field [EMT or ambulance]: Secondary | ICD-10-CM | POA: Diagnosis not present

## 2017-01-26 DIAGNOSIS — E569 Vitamin deficiency, unspecified: Secondary | ICD-10-CM | POA: Diagnosis not present

## 2017-01-26 DIAGNOSIS — R1311 Dysphagia, oral phase: Secondary | ICD-10-CM | POA: Diagnosis not present

## 2017-01-26 DIAGNOSIS — J45909 Unspecified asthma, uncomplicated: Secondary | ICD-10-CM | POA: Diagnosis not present

## 2017-01-26 DIAGNOSIS — I609 Nontraumatic subarachnoid hemorrhage, unspecified: Secondary | ICD-10-CM | POA: Diagnosis not present

## 2017-01-26 DIAGNOSIS — F0390 Unspecified dementia without behavioral disturbance: Secondary | ICD-10-CM | POA: Diagnosis not present

## 2017-01-26 DIAGNOSIS — Z853 Personal history of malignant neoplasm of breast: Secondary | ICD-10-CM | POA: Diagnosis not present

## 2017-01-26 DIAGNOSIS — R279 Unspecified lack of coordination: Secondary | ICD-10-CM | POA: Diagnosis not present

## 2017-01-26 DIAGNOSIS — I639 Cerebral infarction, unspecified: Secondary | ICD-10-CM | POA: Diagnosis not present

## 2017-01-26 DIAGNOSIS — Z7982 Long term (current) use of aspirin: Secondary | ICD-10-CM | POA: Diagnosis not present

## 2017-01-26 DIAGNOSIS — R2681 Unsteadiness on feet: Secondary | ICD-10-CM | POA: Diagnosis not present

## 2017-01-26 DIAGNOSIS — I10 Essential (primary) hypertension: Secondary | ICD-10-CM | POA: Diagnosis not present

## 2017-01-26 DIAGNOSIS — K5900 Constipation, unspecified: Secondary | ICD-10-CM | POA: Diagnosis not present

## 2017-01-26 DIAGNOSIS — R400 Somnolence: Secondary | ICD-10-CM | POA: Diagnosis present

## 2017-01-26 DIAGNOSIS — F0280 Dementia in other diseases classified elsewhere without behavioral disturbance: Secondary | ICD-10-CM | POA: Diagnosis not present

## 2017-01-26 DIAGNOSIS — R4182 Altered mental status, unspecified: Secondary | ICD-10-CM | POA: Diagnosis not present

## 2017-01-26 DIAGNOSIS — Z79890 Hormone replacement therapy (postmenopausal): Secondary | ICD-10-CM | POA: Diagnosis not present

## 2017-01-26 DIAGNOSIS — M6281 Muscle weakness (generalized): Secondary | ICD-10-CM | POA: Diagnosis not present

## 2017-01-28 ENCOUNTER — Emergency Department (HOSPITAL_COMMUNITY)
Admission: EM | Admit: 2017-01-28 | Discharge: 2017-01-29 | Disposition: A | Discharge status: Home / Self Care | Payer: Medicare Other | Attending: Emergency Medicine | Admitting: Emergency Medicine

## 2017-01-28 ENCOUNTER — Emergency Department (HOSPITAL_COMMUNITY): Payer: Medicare Other

## 2017-01-28 ENCOUNTER — Encounter (HOSPITAL_COMMUNITY): Payer: Self-pay | Admitting: Emergency Medicine

## 2017-01-28 DIAGNOSIS — J45909 Unspecified asthma, uncomplicated: Secondary | ICD-10-CM | POA: Diagnosis not present

## 2017-01-28 DIAGNOSIS — I10 Essential (primary) hypertension: Secondary | ICD-10-CM | POA: Insufficient documentation

## 2017-01-28 DIAGNOSIS — Z7982 Long term (current) use of aspirin: Secondary | ICD-10-CM | POA: Insufficient documentation

## 2017-01-28 DIAGNOSIS — R400 Somnolence: Secondary | ICD-10-CM | POA: Diagnosis not present

## 2017-01-28 DIAGNOSIS — Z79899 Other long term (current) drug therapy: Secondary | ICD-10-CM | POA: Insufficient documentation

## 2017-01-28 DIAGNOSIS — Z87891 Personal history of nicotine dependence: Secondary | ICD-10-CM | POA: Insufficient documentation

## 2017-01-28 DIAGNOSIS — R4182 Altered mental status, unspecified: Secondary | ICD-10-CM | POA: Diagnosis not present

## 2017-01-28 LAB — CBC WITH DIFFERENTIAL/PLATELET
BASOS PCT: 0 %
Basophils Absolute: 0 10*3/uL (ref 0.0–0.1)
EOS ABS: 0.2 10*3/uL (ref 0.0–0.7)
EOS PCT: 2 %
HCT: 40.3 % (ref 36.0–46.0)
HEMOGLOBIN: 13.9 g/dL (ref 12.0–15.0)
Lymphocytes Relative: 16 %
Lymphs Abs: 1.4 10*3/uL (ref 0.7–4.0)
MCH: 30.3 pg (ref 26.0–34.0)
MCHC: 34.5 g/dL (ref 30.0–36.0)
MCV: 88 fL (ref 78.0–100.0)
Monocytes Absolute: 1.3 10*3/uL — ABNORMAL HIGH (ref 0.1–1.0)
Monocytes Relative: 14 %
NEUTROS PCT: 68 %
Neutro Abs: 6.1 10*3/uL (ref 1.7–7.7)
PLATELETS: 233 10*3/uL (ref 150–400)
RBC: 4.58 MIL/uL (ref 3.87–5.11)
RDW: 13 % (ref 11.5–15.5)
WBC: 9 10*3/uL (ref 4.0–10.5)

## 2017-01-28 LAB — URINALYSIS, ROUTINE W REFLEX MICROSCOPIC
BILIRUBIN URINE: NEGATIVE
GLUCOSE, UA: NEGATIVE mg/dL
HGB URINE DIPSTICK: NEGATIVE
Ketones, ur: NEGATIVE mg/dL
NITRITE: NEGATIVE
Protein, ur: NEGATIVE mg/dL
SPECIFIC GRAVITY, URINE: 1.015 (ref 1.005–1.030)
Squamous Epithelial / LPF: NONE SEEN
pH: 6 (ref 5.0–8.0)

## 2017-01-28 LAB — COMPREHENSIVE METABOLIC PANEL
ALT: 29 U/L (ref 14–54)
AST: 26 U/L (ref 15–41)
Albumin: 3.4 g/dL — ABNORMAL LOW (ref 3.5–5.0)
Alkaline Phosphatase: 51 U/L (ref 38–126)
Anion gap: 9 (ref 5–15)
BUN: 18 mg/dL (ref 6–20)
CALCIUM: 8.6 mg/dL — AB (ref 8.9–10.3)
CHLORIDE: 99 mmol/L — AB (ref 101–111)
CO2: 26 mmol/L (ref 22–32)
Creatinine, Ser: 0.71 mg/dL (ref 0.44–1.00)
Glucose, Bld: 81 mg/dL (ref 65–99)
Potassium: 3.5 mmol/L (ref 3.5–5.1)
SODIUM: 134 mmol/L — AB (ref 135–145)
Total Bilirubin: 0.8 mg/dL (ref 0.3–1.2)
Total Protein: 6 g/dL — ABNORMAL LOW (ref 6.5–8.1)

## 2017-01-28 NOTE — ED Notes (Signed)
Patient transported to CT 

## 2017-01-28 NOTE — ED Notes (Signed)
POA at bedside.

## 2017-01-28 NOTE — ED Notes (Signed)
Pt back from CT

## 2017-01-28 NOTE — ED Provider Notes (Signed)
[facility] DEPT Provider Note   CSN: [id number] Arrival date & time: 01/28/17  1746     History   Chief Complaint Chief Complaint  Patient presents with  . Altered Mental Status    HPI Kayla Hays is a 76 y.o. female.  Patient is a 76 year old female with a history of dementia with recent hemorrhagic stroke 7 days ago who is currently on Decadron. Patient being brought in by family today due to excessive drowsiness all day long. Patient was able to ambulate and seemed normal in that regard but has been falling asleep excessively. She only ate one meal today but they deny any cough, fever, vomiting or diarrhea. Patient has not had any falls. It was thought that the hemorrhage may be related to her dementia or hypertensive bleed.  Patient's blood pressure was lower earlier today but blood sugar was checked in apparently was within normal limits. Patient has continued her Decadron and does not take any mind altering medications.  She is on no anticoagulation.   The history is provided by a relative. The history is limited by the condition of the patient.  Altered Mental Status      Past Medical History:  Diagnosis Date  . Anxiety   . Asthma   . Hypertension   . PVC's (premature ventricular contractions)     Patient Active Problem List   Diagnosis Date Noted  . PVC's (premature ventricular contractions) 05/18/2013  . Essential hypertension 05/18/2013  . Memory loss 05/18/2013    Past Surgical History:  Procedure Laterality Date  . ABDOMINAL HYSTERECTOMY    . NO PAST SURGERIES      OB History    No data available       Home Medications    Prior to Admission medications   Medication Sig Start Date End Date Taking? Authorizing Provider  albuterol (PROVENTIL HFA;VENTOLIN HFA) 108 (90 Base) MCG/ACT inhaler Inhale 1-2 puffs into the lungs every 4 (four) hours as needed for wheezing or shortness of breath. 09/06/16   Blanchie Dessert, MD  aspirin 81 MG tablet  Take 81 mg by mouth.     Historical Provider, MD  azithromycin (ZITHROMAX) 250 MG tablet Take 1 tablet (250 mg total) by mouth daily. Take first 2 tablets together, then 1 every day until finished. 09/06/16   Blanchie Dessert, MD  cetirizine (ZYRTEC) 10 MG tablet Take 10 mg by mouth daily.    Historical Provider, MD  Dietary Management Product (ENLYTE) CAPS Take 1 capsule by mouth daily.    Historical Provider, MD  FLOVENT HFA 44 MCG/ACT inhaler Inhale 1 puff into the lungs as needed (FOR SHORTNESS OF BREATH AND WHEEZING).  05/10/14   Historical Provider, MD  lisinopril (PRINIVIL,ZESTRIL) 5 MG tablet Take 1 tablet (5 mg total) by mouth daily. NEED OV. 02/11/16   Liliane Shi, PA-C  loperamide (IMODIUM A-D) 2 MG tablet TAKE 1/2 TO TABLET DAILY AS NEEDED FOR DIARRHEA    Historical Provider, MD  metoprolol succinate (TOPROL-XL) 50 MG 24 hr tablet Take 1 tablet (50 mg total) by mouth daily. Take with or immediately following a meal. 08/13/15   Lorretta Harp, MD  Multiple Vitamin (MULTIVITAMIN) capsule Take 1 capsule by mouth daily.    Historical Provider, MD  predniSONE (DELTASONE) 20 MG tablet Take 2 tablets (40 mg total) by mouth daily. 09/06/16   Blanchie Dessert, MD    Family History Family History  Problem Relation Age of Onset  . Breast cancer Mother   .  Heart disease Maternal Grandfather   . Cancer Paternal Grandfather     Social History Social History  Substance Use Topics  . Smoking status: Former Smoker    Quit date: 05/18/1988  . Smokeless tobacco: Never Used  . Alcohol use Yes     Comment: occassional     Allergies   Tetanus toxoids; Theophyllines; Penicillins; Rivastigmine tartrate; and Sulfa antibiotics   Review of Systems Review of Systems  Unable to perform ROS: Dementia     Physical Exam Updated Vital Signs BP (!) 150/77   Pulse (!) 53   Temp 97.8 F (36.6 C) (Oral)   Resp 18   SpO2 100%   Physical Exam  Constitutional: She appears well-developed and  well-nourished. No distress.  HENT:  Head: Normocephalic and atraumatic.  Mouth/Throat: Oropharynx is clear and moist.  Eyes: Conjunctivae and EOM are normal. Pupils are equal, round, and reactive to light.  Neck: Normal range of motion. Neck supple.  Cardiovascular: Normal rate, regular rhythm and intact distal pulses.   No murmur heard. Pulmonary/Chest: Effort normal and breath sounds normal. No respiratory distress. She has no wheezes. She has no rales.  Abdominal: Soft. She exhibits no distension. There is no tenderness. There is no rebound and no guarding.  Musculoskeletal: Normal range of motion. She exhibits no edema or tenderness.  Neurological: She is alert.  Patient does wake up but she does not have much to say. Mild pronator drift noted in the right upper extremity but 5 out of 5 strength in all extremities with normal sensation. Pupils are reactive bilaterally.  Skin: Skin is warm and dry. No rash noted. No erythema.  Psychiatric: She has a normal mood and affect. Her behavior is normal.  Nursing note and vitals reviewed.    ED Treatments / Results  Labs (all labs ordered are listed, but only abnormal results are displayed) Labs Reviewed  CBC WITH DIFFERENTIAL/PLATELET - Abnormal; Notable for the following:       Result Value   Monocytes Absolute 1.3 (*)    All other components within normal limits  COMPREHENSIVE METABOLIC PANEL - Abnormal; Notable for the following:    Sodium 134 (*)    Chloride 99 (*)    Calcium 8.6 (*)    Total Protein 6.0 (*)    Albumin 3.4 (*)    All other components within normal limits  URINALYSIS, ROUTINE W REFLEX MICROSCOPIC - Abnormal; Notable for the following:    APPearance HAZY (*)    Leukocytes, UA TRACE (*)    Bacteria, UA MANY (*)    All other components within normal limits  URINE CULTURE    EKG  EKG Interpretation  Date/Time:  Thursday January 28 2017 17:53:07 EDT Ventricular Rate:  57 PR Interval:    QRS Duration: 89 QT  Interval:  467 QTC Calculation: 455 R Axis:   74 Text Interpretation:  Age not entered, assumed to be  76 years old for purpose of ECG interpretation Sinus rhythm Ventricular premature complex Borderline short PR interval Artifact No significant change since last tracing Confirmed by Maryan Rued  MD, Loree Fee ([id number]) on 01/28/2017 6:06:42 PM       Radiology Ct Head Wo Contrast  Result Date: 01/28/2017 CLINICAL DATA:  Altered mental status. Recent hemorrhage. History of Alzheimer's disease, hypertension. EXAM: CT HEAD WITHOUT CONTRAST TECHNIQUE: Contiguous axial images were obtained from the base of the skull through the vertex without intravenous contrast. COMPARISON:  CT HEAD January 23, 2017 FINDINGS: BRAIN: Evolving large LEFT  frontal intraparenchymal hematoma similar to prior examination with local mass effect on the frontal horn of lateral ventricle. Regional mass effect without significant midline shift. Intraventricular extension with small amount of residual dependent blood products in the occipital horns. Moderate ventriculomegaly is similar, on the basis of global parenchymal brain volume loss. No hydrocephalus. Small amount of redistributed subarachnoid hemorrhage. No new hemorrhage. No acute large vascular territory infarct. Basal cisterns are patent. VASCULAR: Mild calcific atherosclerosis of the carotid siphons. SKULL: No skull fracture. Osteopenia. No significant scalp soft tissue swelling. SINUSES/ORBITS: The mastoid air-cells and included paranasal sinuses are well-aerated.The included ocular globes and orbital contents are non-suspicious. OTHER: None. IMPRESSION: Evolving, similar large LEFT frontal lobe hematoma. Small amount of residual intraventricular blood products without hydrocephalus. Small amount of redistributed subarachnoid hemorrhage. Electronically Signed   By: Elon Alas M.D.   On: 01/28/2017 19:46    Procedures Procedures (including critical care time)  Medications  Ordered in ED Medications - No data to display   Initial Impression / Assessment and Plan / ED Course  I have reviewed the triage vital signs and the nursing notes.  Pertinent labs & imaging results that were available during my care of the patient were reviewed by me and considered in my medical decision making (see chart for details).     Patient with recent intracranial hemorrhage being brought in today for excessive drowsiness. CT today shows evolving similar large left frontal lobe hematoma with a small amount of residual intraventricular blood products without hydrocephalus with a small amount of redistributed subarachnoid hemorrhage. This seems to be unchanged from her prior CT. This is not the cause of her drowsiness. Discussed with family possible seizure activity. She does take Keppra for prophylaxis they said she had some mild trembling of her upper extremities earlier but no witnessed seizure. The patient is not in status at this time. Patient has only mild pronator drift in the right upper extremity but otherwise seems to be neurovascularly intact other than she does not speak much. Her vital signs are without acute findings. Will ensure no evidence of infection such as UTI or other cause or electrolyte abnormality as the cause for her drowsiness. No evidence of hypoglycemia today. Patient did not take any mind altering medications.  Labs pending.  11:29 PM Labs without significant findings. UA without convincing evidence for UTI. Culture sent. Patient is awake and seems to be interacting with family. Recommended follow-up with neurology if symptoms recur. She seems otherwise normal they can continue with rehabilitation and normal plan. Family is comfortable with this plan and patient was discharged home.  Final Clinical Impressions(s) / ED Diagnoses   Final diagnoses:  Somnolence    New Prescriptions New Prescriptions   No medications on file     Blanchie Dessert,  MD 01/28/17 2330

## 2017-01-28 NOTE — ED Triage Notes (Signed)
Pt to ER from nursing facility where patient is there for rehab after recent hemorrhagic stroke last Wednesday. Staff called EMS for altered mental status. LSN 2 am, at baseline patient has alzheimers (4-5 years into), pt said to normally be very talkative and responsive and is normally awake and out of bed early in the morning. Today patient did not wake up til 12 noon and has been lethargic all day today progressively getting worse. Pt VSS. Pt is alert but disoriented.

## 2017-01-31 LAB — URINE CULTURE: Culture: >=100000 — AB

## 2017-02-01 ENCOUNTER — Telehealth: Payer: Self-pay | Admitting: *Deleted

## 2017-02-01 NOTE — Telephone Encounter (Signed)
Post ED Visit - Positive Culture Follow-up: Unsuccessful Patient Follow-up  Culture assessed and recommendations reviewed by:  []  Elenor Quinones, Pharm.D. []  Heide Guile, Pharm.D., BCPS AQ-ID []  Parks Neptune, Pharm.D., BCPS []  Alycia Rossetti, Pharm.D., BCPS []  [place], [place].D., BCPS, AAHIVP []  Legrand Como, Pharm.D., BCPS, AAHIVP []  Salome Arnt, PharmD, BCPS []  Dimitri Ped, PharmD, BCPS []  Vincenza Hews, PharmD, BCPS K. Lacinda Axon, Pharm D Jeff Hedges, PA-C   Positive urine culture  []  Patient discharged without antimicrobial prescription and treatment is now indicated []  Organism is resistant to prescribed ED discharge antimicrobial []  Patient with positive blood cultures   Unable to contact patient after 3 attempts, letter will be sent to address on file  Ardeen Fillers 02/01/2017, 9:24 AM

## 2017-02-01 NOTE — Progress Notes (Signed)
ED Antimicrobial Stewardship Positive Culture Follow Up   Kayla Hays is an 76 y.o. female who presented to [facility] on 01/28/2017 with a chief complaint of  Chief Complaint  Patient presents with  . Altered Mental Status    Recent Results (from the past 720 hour(s))  Urine culture     Status: Abnormal   Collection Time: 01/28/17 10:20 PM  Result Value Ref Range Status   Specimen Description URINE, CLEAN CATCH  Final   Special Requests NONE  Final   Culture >=100,000 COLONIES/mL KLEBSIELLA PNEUMONIAE (A)  Final   Report Status 01/31/2017 FINAL  Final   Organism ID, Bacteria KLEBSIELLA PNEUMONIAE (A)  Final      Susceptibility   Klebsiella pneumoniae - MIC*    AMPICILLIN >=32 RESISTANT Resistant     CEFAZOLIN <=4 SENSITIVE Sensitive     CEFTRIAXONE <=1 SENSITIVE Sensitive     CIPROFLOXACIN <=0.25 SENSITIVE Sensitive     GENTAMICIN <=1 SENSITIVE Sensitive     IMIPENEM <=0.25 SENSITIVE Sensitive     NITROFURANTOIN 32 SENSITIVE Sensitive     TRIMETH/SULFA <=20 SENSITIVE Sensitive     AMPICILLIN/SULBACTAM 4 SENSITIVE Sensitive     PIP/TAZO <=4 SENSITIVE Sensitive     Extended ESBL NEGATIVE Sensitive     * >=100,000 COLONIES/mL KLEBSIELLA PNEUMONIAE     [x]  Patient discharged originally without antimicrobial agent and treatment is now indicated  Per EDP, call patient for symptom check. If back to baseline, okay not to treat. If altered mental status persists (different from baseline), then treat with the following:   Ciprofloxacin 250 mg PO BID x 3 days   ED Provider: Lenn Sink, PA-C  Argie Ramming, PharmD Pharmacy Resident  Pager [phone] 02/01/17 8:59 AM

## 2017-02-08 ENCOUNTER — Emergency Department (HOSPITAL_COMMUNITY)
Admission: EM | Admit: 2017-02-08 | Discharge: 2017-02-08 | Disposition: A | Discharge status: Home / Self Care | Payer: Medicare Other | Attending: Emergency Medicine | Admitting: Emergency Medicine

## 2017-02-08 ENCOUNTER — Ambulatory Visit (HOSPITAL_COMMUNITY)
Admission: EM | Admit: 2017-02-08 | Discharge: 2017-02-08 | Disposition: A | Discharge status: Nursing Home (Includes ICF) | Payer: Medicare Other | Source: Home / Self Care

## 2017-02-08 ENCOUNTER — Encounter (HOSPITAL_COMMUNITY): Payer: Self-pay | Admitting: Emergency Medicine

## 2017-02-08 DIAGNOSIS — I10 Essential (primary) hypertension: Secondary | ICD-10-CM | POA: Diagnosis not present

## 2017-02-08 DIAGNOSIS — J45909 Unspecified asthma, uncomplicated: Secondary | ICD-10-CM | POA: Insufficient documentation

## 2017-02-08 DIAGNOSIS — Z8659 Personal history of other mental and behavioral disorders: Secondary | ICD-10-CM

## 2017-02-08 DIAGNOSIS — W182XXA Fall in (into) shower or empty bathtub, initial encounter: Secondary | ICD-10-CM | POA: Insufficient documentation

## 2017-02-08 DIAGNOSIS — Z79899 Other long term (current) drug therapy: Secondary | ICD-10-CM | POA: Insufficient documentation

## 2017-02-08 DIAGNOSIS — S01511A Laceration without foreign body of lip, initial encounter: Secondary | ICD-10-CM | POA: Diagnosis not present

## 2017-02-08 DIAGNOSIS — W19XXXA Unspecified fall, initial encounter: Secondary | ICD-10-CM

## 2017-02-08 DIAGNOSIS — Z7982 Long term (current) use of aspirin: Secondary | ICD-10-CM | POA: Insufficient documentation

## 2017-02-08 DIAGNOSIS — Z8673 Personal history of transient ischemic attack (TIA), and cerebral infarction without residual deficits: Secondary | ICD-10-CM | POA: Diagnosis not present

## 2017-02-08 DIAGNOSIS — Z87891 Personal history of nicotine dependence: Secondary | ICD-10-CM | POA: Insufficient documentation

## 2017-02-08 DIAGNOSIS — Y9389 Activity, other specified: Secondary | ICD-10-CM | POA: Insufficient documentation

## 2017-02-08 DIAGNOSIS — Y92002 Bathroom of unspecified non-institutional (private) residence single-family (private) house as the place of occurrence of the external cause: Secondary | ICD-10-CM | POA: Insufficient documentation

## 2017-02-08 DIAGNOSIS — Y999 Unspecified external cause status: Secondary | ICD-10-CM | POA: Insufficient documentation

## 2017-02-08 MED ORDER — LIDOCAINE HCL (PF) 1 % IJ SOLN
5.0000 mL | Freq: Once | INTRAMUSCULAR | Status: AC
  Administered 2017-02-08: 5 mL via INTRADERMAL
  Filled 2017-02-08: qty 5

## 2017-02-08 NOTE — ED Provider Notes (Signed)
[facility] DEPT Provider Note   CSN: [id number] Arrival date & time: 02/08/17  1143     History   Chief Complaint Chief Complaint  Patient presents with  . Fall  . Laceration    HPI Kayla Hays is a 76 y.o. female.  Patient is a 76 year old female with past medical history of dementia, hypertension, and recent stroke. This occurred earlier this month. She was discharged to a rehabilitation facility where she slipped and fell in the shower this morning. She has a laceration to the right upper lip. There is no reported loss of consciousness, and the patient denies any other symptoms.  History is somewhat difficult secondary to patient's dementia. Much of the history was obtained through the caregiver and daughter who are present at bedside.   The history is provided by the patient.  Fall  This is a new problem. The current episode started less than 1 hour ago. The problem occurs constantly. The problem has not changed since onset.Pertinent negatives include no chest pain, no abdominal pain, no headaches and no shortness of breath. Nothing aggravates the symptoms. Nothing relieves the symptoms. She has tried nothing for the symptoms.  Laceration      Past Medical History:  Diagnosis Date  . Anxiety   . Asthma   . Hypertension   . PVC's (premature ventricular contractions)     Patient Active Problem List   Diagnosis Date Noted  . PVC's (premature ventricular contractions) 05/18/2013  . Essential hypertension 05/18/2013  . Memory loss 05/18/2013    Past Surgical History:  Procedure Laterality Date  . ABDOMINAL HYSTERECTOMY    . NO PAST SURGERIES      OB History    No data available       Home Medications    Prior to Admission medications   Medication Sig Start Date End Date Taking? Authorizing Provider  albuterol (PROVENTIL HFA;VENTOLIN HFA) 108 (90 Base) MCG/ACT inhaler Inhale 1-2 puffs into the lungs every 4 (four) hours as needed for wheezing or  shortness of breath. 09/06/16   Blanchie Dessert, MD  aspirin 81 MG tablet Take 81 mg by mouth.     Historical Provider, MD  azithromycin (ZITHROMAX) 250 MG tablet Take 1 tablet (250 mg total) by mouth daily. Take first 2 tablets together, then 1 every day until finished. 09/06/16   Blanchie Dessert, MD  cetirizine (ZYRTEC) 10 MG tablet Take 10 mg by mouth daily.    Historical Provider, MD  Dietary Management Product (ENLYTE) CAPS Take 1 capsule by mouth daily.    Historical Provider, MD  FLOVENT HFA 44 MCG/ACT inhaler Inhale 1 puff into the lungs as needed (FOR SHORTNESS OF BREATH AND WHEEZING).  05/10/14   Historical Provider, MD  lisinopril (PRINIVIL,ZESTRIL) 5 MG tablet Take 1 tablet (5 mg total) by mouth daily. NEED OV. 02/11/16   Liliane Shi, PA-C  loperamide (IMODIUM A-D) 2 MG tablet TAKE 1/2 TO TABLET DAILY AS NEEDED FOR DIARRHEA    Historical Provider, MD  metoprolol succinate (TOPROL-XL) 50 MG 24 hr tablet Take 1 tablet (50 mg total) by mouth daily. Take with or immediately following a meal. 08/13/15   Lorretta Harp, MD  Multiple Vitamin (MULTIVITAMIN) capsule Take 1 capsule by mouth daily.    Historical Provider, MD  predniSONE (DELTASONE) 20 MG tablet Take 2 tablets (40 mg total) by mouth daily. 09/06/16   Blanchie Dessert, MD    Family History Family History  Problem Relation Age of Onset  .  Breast cancer Mother   . Heart disease Maternal Grandfather   . Cancer Paternal Grandfather     Social History Social History  Substance Use Topics  . Smoking status: Former Smoker    Quit date: 05/18/1988  . Smokeless tobacco: Never Used  . Alcohol use Yes     Comment: occassional     Allergies   Tetanus toxoids; Theophyllines; Penicillins; Rivastigmine tartrate; and Sulfa antibiotics   Review of Systems Review of Systems  Respiratory: Negative for shortness of breath.   Cardiovascular: Negative for chest pain.  Gastrointestinal: Negative for abdominal pain.    Neurological: Negative for headaches.  All other systems reviewed and are negative.    Physical Exam Updated Vital Signs BP 128/64   Pulse 80   Temp (!) 96.4 F (35.8 C) (Axillary)   Resp 20   SpO2 98%   Physical Exam  Constitutional: She appears well-developed and well-nourished. No distress.  HENT:  Head: Normocephalic.  There is a 1.5 cm laceration to the right upper lip. There is no active bleeding. There are abrasions to the inside of the lip, however this does not appear to be a through and through laceration. I see no chip or cracking of the dentition. All teeth appear to be firmly embedded in the socket.  Eyes: EOM are normal. Pupils are equal, round, and reactive to light.  Neck: Normal range of motion. Neck supple.  There is no cervical spine tenderness or step-off.  Cardiovascular: Normal rate and regular rhythm.  Exam reveals no gallop and no friction rub.   No murmur heard. Pulmonary/Chest: Effort normal and breath sounds normal. No respiratory distress. She has no wheezes.  Abdominal: Soft. Bowel sounds are normal. She exhibits no distension. There is no tenderness.  Musculoskeletal: Normal range of motion.  Neurological: She is alert. No cranial nerve deficit.  Neurologic exam somewhat difficult secondary to dementia. She does move all extremities and follows commands.  Skin: Skin is warm and dry. She is not diaphoretic.  Nursing note and vitals reviewed.    ED Treatments / Results  Labs (all labs ordered are listed, but only abnormal results are displayed) Labs Reviewed - No data to display  EKG  EKG Interpretation None       Radiology No results found.  Procedures Procedures (including critical care time)  Medications Ordered in ED Medications - No data to display   Initial Impression / Assessment and Plan / ED Course  I have reviewed the triage vital signs and the nursing notes.  Pertinent labs & imaging results that were available during  my care of the patient were reviewed by me and considered in my medical decision making (see chart for details).  Patient presents after a fall. She has a 1.5 cm laceration to the upper lip that may or may not be a through and through laceration. Either way, the external laceration was closed with sutures as below. The patient has no other complaints and the family members seem to think she has at her baseline. She is not taking any blood thinners and I see no indication at this time for imaging studies. She has been experiencing some diarrhea recently and the daughter inquired about a C. difficile test. This can be acquired at the extended care facility as she has not had a bowel movement here.  LACERATION REPAIR Performed by: Veryl Speak Authorized by: Veryl Speak Consent: Verbal consent obtained. Risks and benefits: risks, benefits and alternatives were discussed Consent given by:  patient Patient identity confirmed: provided demographic data Prepped and Draped in normal sterile fashion Wound explored  Laceration Location: right upper lip  Laceration Length: 1.5cm  No Foreign Bodies seen or palpated  Anesthesia: local infiltration  Local anesthetic: lidocaine 1% without epinephrine  Anesthetic total: 2 ml  Irrigation method: syringe Amount of cleaning: standard  Skin closure: 6-0 prolene  Number of sutures: 2  Technique: simple interrupted  Patient tolerance: Patient tolerated the procedure well with no immediate complications.   Final Clinical Impressions(s) / ED Diagnoses   Final diagnoses:  None    New Prescriptions New Prescriptions   No medications on file     Veryl Speak, MD 02/08/17 1317

## 2017-02-08 NOTE — ED Triage Notes (Signed)
Family with pt states pt fell in the shower today at the nursing facility at which she lives, no LOC, small lac to upper right lip. Hx of stroke last may.

## 2017-02-08 NOTE — ED Notes (Signed)
Pt. Placed in yellow fall risk socks and bracelet. EDP at bedside.

## 2017-02-08 NOTE — Discharge Instructions (Signed)
Local wound care with bacitracin and Band-Aid changes twice daily.  Sutures are to be removed in 5 days. Please follow-up with primary Dr. for this.  Return to the emergency department for severe headache, changes in behavior, or other new and concerning symptoms.

## 2017-02-09 ENCOUNTER — Other Ambulatory Visit: Payer: Self-pay | Admitting: Family Medicine

## 2017-02-09 DIAGNOSIS — I10 Essential (primary) hypertension: Secondary | ICD-10-CM | POA: Diagnosis not present

## 2017-02-09 DIAGNOSIS — R41841 Cognitive communication deficit: Secondary | ICD-10-CM | POA: Diagnosis not present

## 2017-02-09 DIAGNOSIS — I619 Nontraumatic intracerebral hemorrhage, unspecified: Secondary | ICD-10-CM | POA: Diagnosis not present

## 2017-02-10 DIAGNOSIS — F329 Major depressive disorder, single episode, unspecified: Secondary | ICD-10-CM | POA: Diagnosis not present

## 2017-02-10 DIAGNOSIS — F0280 Dementia in other diseases classified elsewhere without behavioral disturbance: Secondary | ICD-10-CM | POA: Diagnosis not present

## 2017-02-10 DIAGNOSIS — G309 Alzheimer's disease, unspecified: Secondary | ICD-10-CM | POA: Diagnosis not present

## 2017-02-10 DIAGNOSIS — I10 Essential (primary) hypertension: Secondary | ICD-10-CM | POA: Diagnosis not present

## 2017-02-10 DIAGNOSIS — I69093 Ataxia following nontraumatic subarachnoid hemorrhage: Secondary | ICD-10-CM | POA: Diagnosis not present

## 2017-02-10 DIAGNOSIS — I69193 Ataxia following nontraumatic intracerebral hemorrhage: Secondary | ICD-10-CM | POA: Diagnosis not present

## 2017-02-12 DIAGNOSIS — F0280 Dementia in other diseases classified elsewhere without behavioral disturbance: Secondary | ICD-10-CM | POA: Diagnosis not present

## 2017-02-12 DIAGNOSIS — G309 Alzheimer's disease, unspecified: Secondary | ICD-10-CM | POA: Diagnosis not present

## 2017-02-12 DIAGNOSIS — I69093 Ataxia following nontraumatic subarachnoid hemorrhage: Secondary | ICD-10-CM | POA: Diagnosis not present

## 2017-02-12 DIAGNOSIS — F329 Major depressive disorder, single episode, unspecified: Secondary | ICD-10-CM | POA: Diagnosis not present

## 2017-02-12 DIAGNOSIS — I69193 Ataxia following nontraumatic intracerebral hemorrhage: Secondary | ICD-10-CM | POA: Diagnosis not present

## 2017-02-12 DIAGNOSIS — I10 Essential (primary) hypertension: Secondary | ICD-10-CM | POA: Diagnosis not present

## 2017-02-16 DIAGNOSIS — F0280 Dementia in other diseases classified elsewhere without behavioral disturbance: Secondary | ICD-10-CM | POA: Diagnosis not present

## 2017-02-16 DIAGNOSIS — I69093 Ataxia following nontraumatic subarachnoid hemorrhage: Secondary | ICD-10-CM | POA: Diagnosis not present

## 2017-02-16 DIAGNOSIS — F329 Major depressive disorder, single episode, unspecified: Secondary | ICD-10-CM | POA: Diagnosis not present

## 2017-02-16 DIAGNOSIS — G309 Alzheimer's disease, unspecified: Secondary | ICD-10-CM | POA: Diagnosis not present

## 2017-02-16 DIAGNOSIS — I10 Essential (primary) hypertension: Secondary | ICD-10-CM | POA: Diagnosis not present

## 2017-02-16 DIAGNOSIS — I69193 Ataxia following nontraumatic intracerebral hemorrhage: Secondary | ICD-10-CM | POA: Diagnosis not present

## 2017-02-17 ENCOUNTER — Ambulatory Visit: Payer: Self-pay | Admitting: Neurology

## 2017-02-18 DIAGNOSIS — G309 Alzheimer's disease, unspecified: Secondary | ICD-10-CM | POA: Diagnosis not present

## 2017-02-18 DIAGNOSIS — I10 Essential (primary) hypertension: Secondary | ICD-10-CM | POA: Diagnosis not present

## 2017-02-18 DIAGNOSIS — F329 Major depressive disorder, single episode, unspecified: Secondary | ICD-10-CM | POA: Diagnosis not present

## 2017-02-18 DIAGNOSIS — I69093 Ataxia following nontraumatic subarachnoid hemorrhage: Secondary | ICD-10-CM | POA: Diagnosis not present

## 2017-02-18 DIAGNOSIS — F0280 Dementia in other diseases classified elsewhere without behavioral disturbance: Secondary | ICD-10-CM | POA: Diagnosis not present

## 2017-02-18 DIAGNOSIS — I69193 Ataxia following nontraumatic intracerebral hemorrhage: Secondary | ICD-10-CM | POA: Diagnosis not present

## 2017-02-19 DIAGNOSIS — Z6821 Body mass index (BMI) 21.0-21.9, adult: Secondary | ICD-10-CM | POA: Diagnosis not present

## 2017-02-19 DIAGNOSIS — F0281 Dementia in other diseases classified elsewhere with behavioral disturbance: Secondary | ICD-10-CM | POA: Diagnosis not present

## 2017-02-19 DIAGNOSIS — I69193 Ataxia following nontraumatic intracerebral hemorrhage: Secondary | ICD-10-CM | POA: Diagnosis not present

## 2017-02-19 DIAGNOSIS — F0280 Dementia in other diseases classified elsewhere without behavioral disturbance: Secondary | ICD-10-CM | POA: Diagnosis not present

## 2017-02-19 DIAGNOSIS — I69093 Ataxia following nontraumatic subarachnoid hemorrhage: Secondary | ICD-10-CM | POA: Diagnosis not present

## 2017-02-19 DIAGNOSIS — I10 Essential (primary) hypertension: Secondary | ICD-10-CM | POA: Diagnosis not present

## 2017-02-19 DIAGNOSIS — G309 Alzheimer's disease, unspecified: Secondary | ICD-10-CM | POA: Diagnosis not present

## 2017-02-19 DIAGNOSIS — I619 Nontraumatic intracerebral hemorrhage, unspecified: Secondary | ICD-10-CM | POA: Diagnosis not present

## 2017-02-19 DIAGNOSIS — F329 Major depressive disorder, single episode, unspecified: Secondary | ICD-10-CM | POA: Diagnosis not present

## 2017-02-19 DIAGNOSIS — G300 Alzheimer's disease with early onset: Secondary | ICD-10-CM | POA: Diagnosis not present

## 2017-02-22 DIAGNOSIS — F0280 Dementia in other diseases classified elsewhere without behavioral disturbance: Secondary | ICD-10-CM | POA: Diagnosis not present

## 2017-02-22 DIAGNOSIS — F329 Major depressive disorder, single episode, unspecified: Secondary | ICD-10-CM | POA: Diagnosis not present

## 2017-02-22 DIAGNOSIS — I69193 Ataxia following nontraumatic intracerebral hemorrhage: Secondary | ICD-10-CM | POA: Diagnosis not present

## 2017-02-22 DIAGNOSIS — G309 Alzheimer's disease, unspecified: Secondary | ICD-10-CM | POA: Diagnosis not present

## 2017-02-22 DIAGNOSIS — I69093 Ataxia following nontraumatic subarachnoid hemorrhage: Secondary | ICD-10-CM | POA: Diagnosis not present

## 2017-02-22 DIAGNOSIS — I10 Essential (primary) hypertension: Secondary | ICD-10-CM | POA: Diagnosis not present

## 2017-02-23 DIAGNOSIS — F329 Major depressive disorder, single episode, unspecified: Secondary | ICD-10-CM | POA: Diagnosis not present

## 2017-02-23 DIAGNOSIS — I10 Essential (primary) hypertension: Secondary | ICD-10-CM | POA: Diagnosis not present

## 2017-02-23 DIAGNOSIS — F0280 Dementia in other diseases classified elsewhere without behavioral disturbance: Secondary | ICD-10-CM | POA: Diagnosis not present

## 2017-02-23 DIAGNOSIS — G309 Alzheimer's disease, unspecified: Secondary | ICD-10-CM | POA: Diagnosis not present

## 2017-02-23 DIAGNOSIS — I69193 Ataxia following nontraumatic intracerebral hemorrhage: Secondary | ICD-10-CM | POA: Diagnosis not present

## 2017-02-23 DIAGNOSIS — I69093 Ataxia following nontraumatic subarachnoid hemorrhage: Secondary | ICD-10-CM | POA: Diagnosis not present

## 2017-02-24 DIAGNOSIS — F0280 Dementia in other diseases classified elsewhere without behavioral disturbance: Secondary | ICD-10-CM | POA: Diagnosis not present

## 2017-02-24 DIAGNOSIS — I69193 Ataxia following nontraumatic intracerebral hemorrhage: Secondary | ICD-10-CM | POA: Diagnosis not present

## 2017-02-24 DIAGNOSIS — I69093 Ataxia following nontraumatic subarachnoid hemorrhage: Secondary | ICD-10-CM | POA: Diagnosis not present

## 2017-02-24 DIAGNOSIS — G309 Alzheimer's disease, unspecified: Secondary | ICD-10-CM | POA: Diagnosis not present

## 2017-02-24 DIAGNOSIS — I10 Essential (primary) hypertension: Secondary | ICD-10-CM | POA: Diagnosis not present

## 2017-02-24 DIAGNOSIS — F329 Major depressive disorder, single episode, unspecified: Secondary | ICD-10-CM | POA: Diagnosis not present

## 2017-02-26 DIAGNOSIS — R460 Very low level of personal hygiene: Secondary | ICD-10-CM | POA: Diagnosis not present

## 2017-02-26 DIAGNOSIS — I10 Essential (primary) hypertension: Secondary | ICD-10-CM | POA: Diagnosis not present

## 2017-02-26 DIAGNOSIS — I619 Nontraumatic intracerebral hemorrhage, unspecified: Secondary | ICD-10-CM | POA: Diagnosis not present

## 2017-02-26 DIAGNOSIS — R634 Abnormal weight loss: Secondary | ICD-10-CM | POA: Diagnosis not present

## 2017-02-26 DIAGNOSIS — G309 Alzheimer's disease, unspecified: Secondary | ICD-10-CM | POA: Diagnosis not present

## 2017-02-26 DIAGNOSIS — R5381 Other malaise: Secondary | ICD-10-CM | POA: Diagnosis not present

## 2017-03-01 DIAGNOSIS — F329 Major depressive disorder, single episode, unspecified: Secondary | ICD-10-CM | POA: Diagnosis not present

## 2017-03-01 DIAGNOSIS — G309 Alzheimer's disease, unspecified: Secondary | ICD-10-CM | POA: Diagnosis not present

## 2017-03-01 DIAGNOSIS — F0280 Dementia in other diseases classified elsewhere without behavioral disturbance: Secondary | ICD-10-CM | POA: Diagnosis not present

## 2017-03-01 DIAGNOSIS — I10 Essential (primary) hypertension: Secondary | ICD-10-CM | POA: Diagnosis not present

## 2017-03-01 DIAGNOSIS — I69093 Ataxia following nontraumatic subarachnoid hemorrhage: Secondary | ICD-10-CM | POA: Diagnosis not present

## 2017-03-01 DIAGNOSIS — I69193 Ataxia following nontraumatic intracerebral hemorrhage: Secondary | ICD-10-CM | POA: Diagnosis not present

## 2017-03-02 DIAGNOSIS — F0280 Dementia in other diseases classified elsewhere without behavioral disturbance: Secondary | ICD-10-CM | POA: Diagnosis not present

## 2017-03-02 DIAGNOSIS — I69193 Ataxia following nontraumatic intracerebral hemorrhage: Secondary | ICD-10-CM | POA: Diagnosis not present

## 2017-03-02 DIAGNOSIS — G309 Alzheimer's disease, unspecified: Secondary | ICD-10-CM | POA: Diagnosis not present

## 2017-03-02 DIAGNOSIS — I10 Essential (primary) hypertension: Secondary | ICD-10-CM | POA: Diagnosis not present

## 2017-03-02 DIAGNOSIS — F329 Major depressive disorder, single episode, unspecified: Secondary | ICD-10-CM | POA: Diagnosis not present

## 2017-03-02 DIAGNOSIS — I69093 Ataxia following nontraumatic subarachnoid hemorrhage: Secondary | ICD-10-CM | POA: Diagnosis not present

## 2017-03-03 DIAGNOSIS — G309 Alzheimer's disease, unspecified: Secondary | ICD-10-CM | POA: Diagnosis not present

## 2017-03-03 DIAGNOSIS — F0280 Dementia in other diseases classified elsewhere without behavioral disturbance: Secondary | ICD-10-CM | POA: Diagnosis not present

## 2017-03-03 DIAGNOSIS — I10 Essential (primary) hypertension: Secondary | ICD-10-CM | POA: Diagnosis not present

## 2017-03-03 DIAGNOSIS — I69193 Ataxia following nontraumatic intracerebral hemorrhage: Secondary | ICD-10-CM | POA: Diagnosis not present

## 2017-03-03 DIAGNOSIS — I69093 Ataxia following nontraumatic subarachnoid hemorrhage: Secondary | ICD-10-CM | POA: Diagnosis not present

## 2017-03-03 DIAGNOSIS — F329 Major depressive disorder, single episode, unspecified: Secondary | ICD-10-CM | POA: Diagnosis not present

## 2017-03-04 DIAGNOSIS — G309 Alzheimer's disease, unspecified: Secondary | ICD-10-CM | POA: Diagnosis not present

## 2017-03-04 DIAGNOSIS — I69093 Ataxia following nontraumatic subarachnoid hemorrhage: Secondary | ICD-10-CM | POA: Diagnosis not present

## 2017-03-04 DIAGNOSIS — I69193 Ataxia following nontraumatic intracerebral hemorrhage: Secondary | ICD-10-CM | POA: Diagnosis not present

## 2017-03-04 DIAGNOSIS — F0280 Dementia in other diseases classified elsewhere without behavioral disturbance: Secondary | ICD-10-CM | POA: Diagnosis not present

## 2017-03-04 DIAGNOSIS — F329 Major depressive disorder, single episode, unspecified: Secondary | ICD-10-CM | POA: Diagnosis not present

## 2017-03-04 DIAGNOSIS — I10 Essential (primary) hypertension: Secondary | ICD-10-CM | POA: Diagnosis not present

## 2017-03-08 DIAGNOSIS — F0280 Dementia in other diseases classified elsewhere without behavioral disturbance: Secondary | ICD-10-CM | POA: Diagnosis not present

## 2017-03-08 DIAGNOSIS — I69093 Ataxia following nontraumatic subarachnoid hemorrhage: Secondary | ICD-10-CM | POA: Diagnosis not present

## 2017-03-08 DIAGNOSIS — I10 Essential (primary) hypertension: Secondary | ICD-10-CM | POA: Diagnosis not present

## 2017-03-08 DIAGNOSIS — F329 Major depressive disorder, single episode, unspecified: Secondary | ICD-10-CM | POA: Diagnosis not present

## 2017-03-08 DIAGNOSIS — I69193 Ataxia following nontraumatic intracerebral hemorrhage: Secondary | ICD-10-CM | POA: Diagnosis not present

## 2017-03-08 DIAGNOSIS — G309 Alzheimer's disease, unspecified: Secondary | ICD-10-CM | POA: Diagnosis not present

## 2017-03-09 DIAGNOSIS — R197 Diarrhea, unspecified: Secondary | ICD-10-CM | POA: Diagnosis not present

## 2017-03-10 DIAGNOSIS — F329 Major depressive disorder, single episode, unspecified: Secondary | ICD-10-CM | POA: Diagnosis not present

## 2017-03-10 DIAGNOSIS — I69193 Ataxia following nontraumatic intracerebral hemorrhage: Secondary | ICD-10-CM | POA: Diagnosis not present

## 2017-03-10 DIAGNOSIS — G309 Alzheimer's disease, unspecified: Secondary | ICD-10-CM | POA: Diagnosis not present

## 2017-03-10 DIAGNOSIS — I10 Essential (primary) hypertension: Secondary | ICD-10-CM | POA: Diagnosis not present

## 2017-03-10 DIAGNOSIS — F0280 Dementia in other diseases classified elsewhere without behavioral disturbance: Secondary | ICD-10-CM | POA: Diagnosis not present

## 2017-03-10 DIAGNOSIS — I69093 Ataxia following nontraumatic subarachnoid hemorrhage: Secondary | ICD-10-CM | POA: Diagnosis not present

## 2017-03-11 DIAGNOSIS — F329 Major depressive disorder, single episode, unspecified: Secondary | ICD-10-CM | POA: Diagnosis not present

## 2017-03-11 DIAGNOSIS — I69093 Ataxia following nontraumatic subarachnoid hemorrhage: Secondary | ICD-10-CM | POA: Diagnosis not present

## 2017-03-11 DIAGNOSIS — G309 Alzheimer's disease, unspecified: Secondary | ICD-10-CM | POA: Diagnosis not present

## 2017-03-11 DIAGNOSIS — I69193 Ataxia following nontraumatic intracerebral hemorrhage: Secondary | ICD-10-CM | POA: Diagnosis not present

## 2017-03-11 DIAGNOSIS — I10 Essential (primary) hypertension: Secondary | ICD-10-CM | POA: Diagnosis not present

## 2017-03-11 DIAGNOSIS — F0280 Dementia in other diseases classified elsewhere without behavioral disturbance: Secondary | ICD-10-CM | POA: Diagnosis not present

## 2017-03-17 ENCOUNTER — Ambulatory Visit (INDEPENDENT_AMBULATORY_CARE_PROVIDER_SITE_OTHER): Payer: Medicare Other | Admitting: Family Medicine

## 2017-03-17 DIAGNOSIS — F0280 Dementia in other diseases classified elsewhere without behavioral disturbance: Secondary | ICD-10-CM | POA: Diagnosis not present

## 2017-03-17 DIAGNOSIS — I69193 Ataxia following nontraumatic intracerebral hemorrhage: Secondary | ICD-10-CM | POA: Diagnosis not present

## 2017-03-17 DIAGNOSIS — I69093 Ataxia following nontraumatic subarachnoid hemorrhage: Secondary | ICD-10-CM

## 2017-03-17 DIAGNOSIS — F329 Major depressive disorder, single episode, unspecified: Secondary | ICD-10-CM

## 2017-03-17 DIAGNOSIS — I10 Essential (primary) hypertension: Secondary | ICD-10-CM

## 2017-03-17 DIAGNOSIS — R2689 Other abnormalities of gait and mobility: Secondary | ICD-10-CM | POA: Diagnosis not present

## 2017-03-17 DIAGNOSIS — Z7982 Long term (current) use of aspirin: Secondary | ICD-10-CM

## 2017-03-17 DIAGNOSIS — G309 Alzheimer's disease, unspecified: Secondary | ICD-10-CM

## 2017-03-19 DIAGNOSIS — G319 Degenerative disease of nervous system, unspecified: Secondary | ICD-10-CM | POA: Diagnosis not present

## 2017-03-19 DIAGNOSIS — G300 Alzheimer's disease with early onset: Secondary | ICD-10-CM | POA: Diagnosis not present

## 2017-03-19 DIAGNOSIS — I6910 Unspecified sequelae of nontraumatic intracerebral hemorrhage: Secondary | ICD-10-CM | POA: Diagnosis not present

## 2017-03-19 DIAGNOSIS — I619 Nontraumatic intracerebral hemorrhage, unspecified: Secondary | ICD-10-CM | POA: Diagnosis not present

## 2017-03-19 DIAGNOSIS — Z6820 Body mass index (BMI) 20.0-20.9, adult: Secondary | ICD-10-CM | POA: Diagnosis not present

## 2017-03-19 DIAGNOSIS — G911 Obstructive hydrocephalus: Secondary | ICD-10-CM | POA: Diagnosis not present

## 2017-03-19 DIAGNOSIS — I609 Nontraumatic subarachnoid hemorrhage, unspecified: Secondary | ICD-10-CM | POA: Diagnosis not present

## 2017-03-19 DIAGNOSIS — F0281 Dementia in other diseases classified elsewhere with behavioral disturbance: Secondary | ICD-10-CM | POA: Diagnosis not present

## 2017-03-23 IMAGING — CR DG CHEST 2V
2 series · 2 of 2 positions shown · non-contrast
Comparison: None.

CLINICAL DATA: Cough and shortness of breath

EXAM:
CHEST  2 VIEW

[w chest pa]
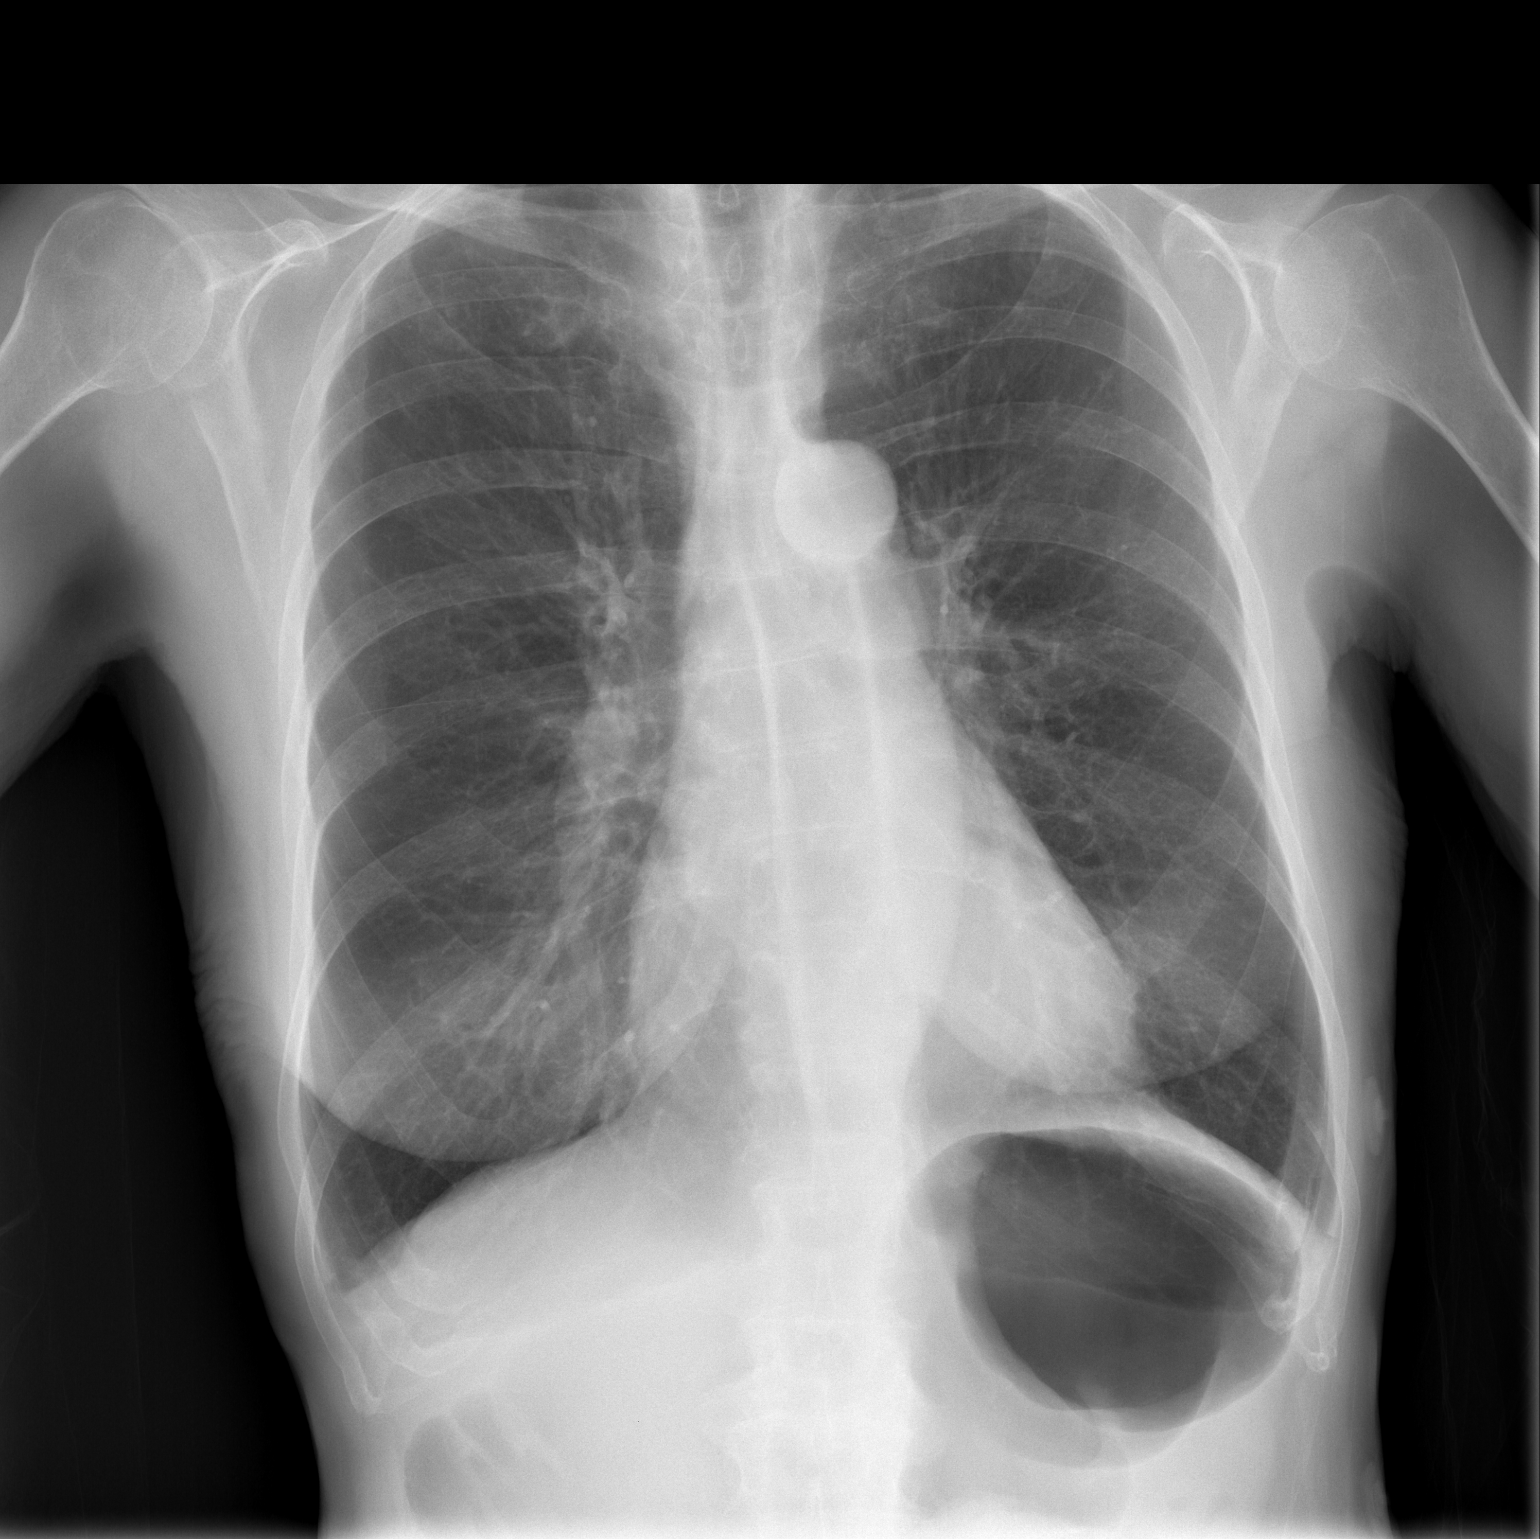

[w chest lat]
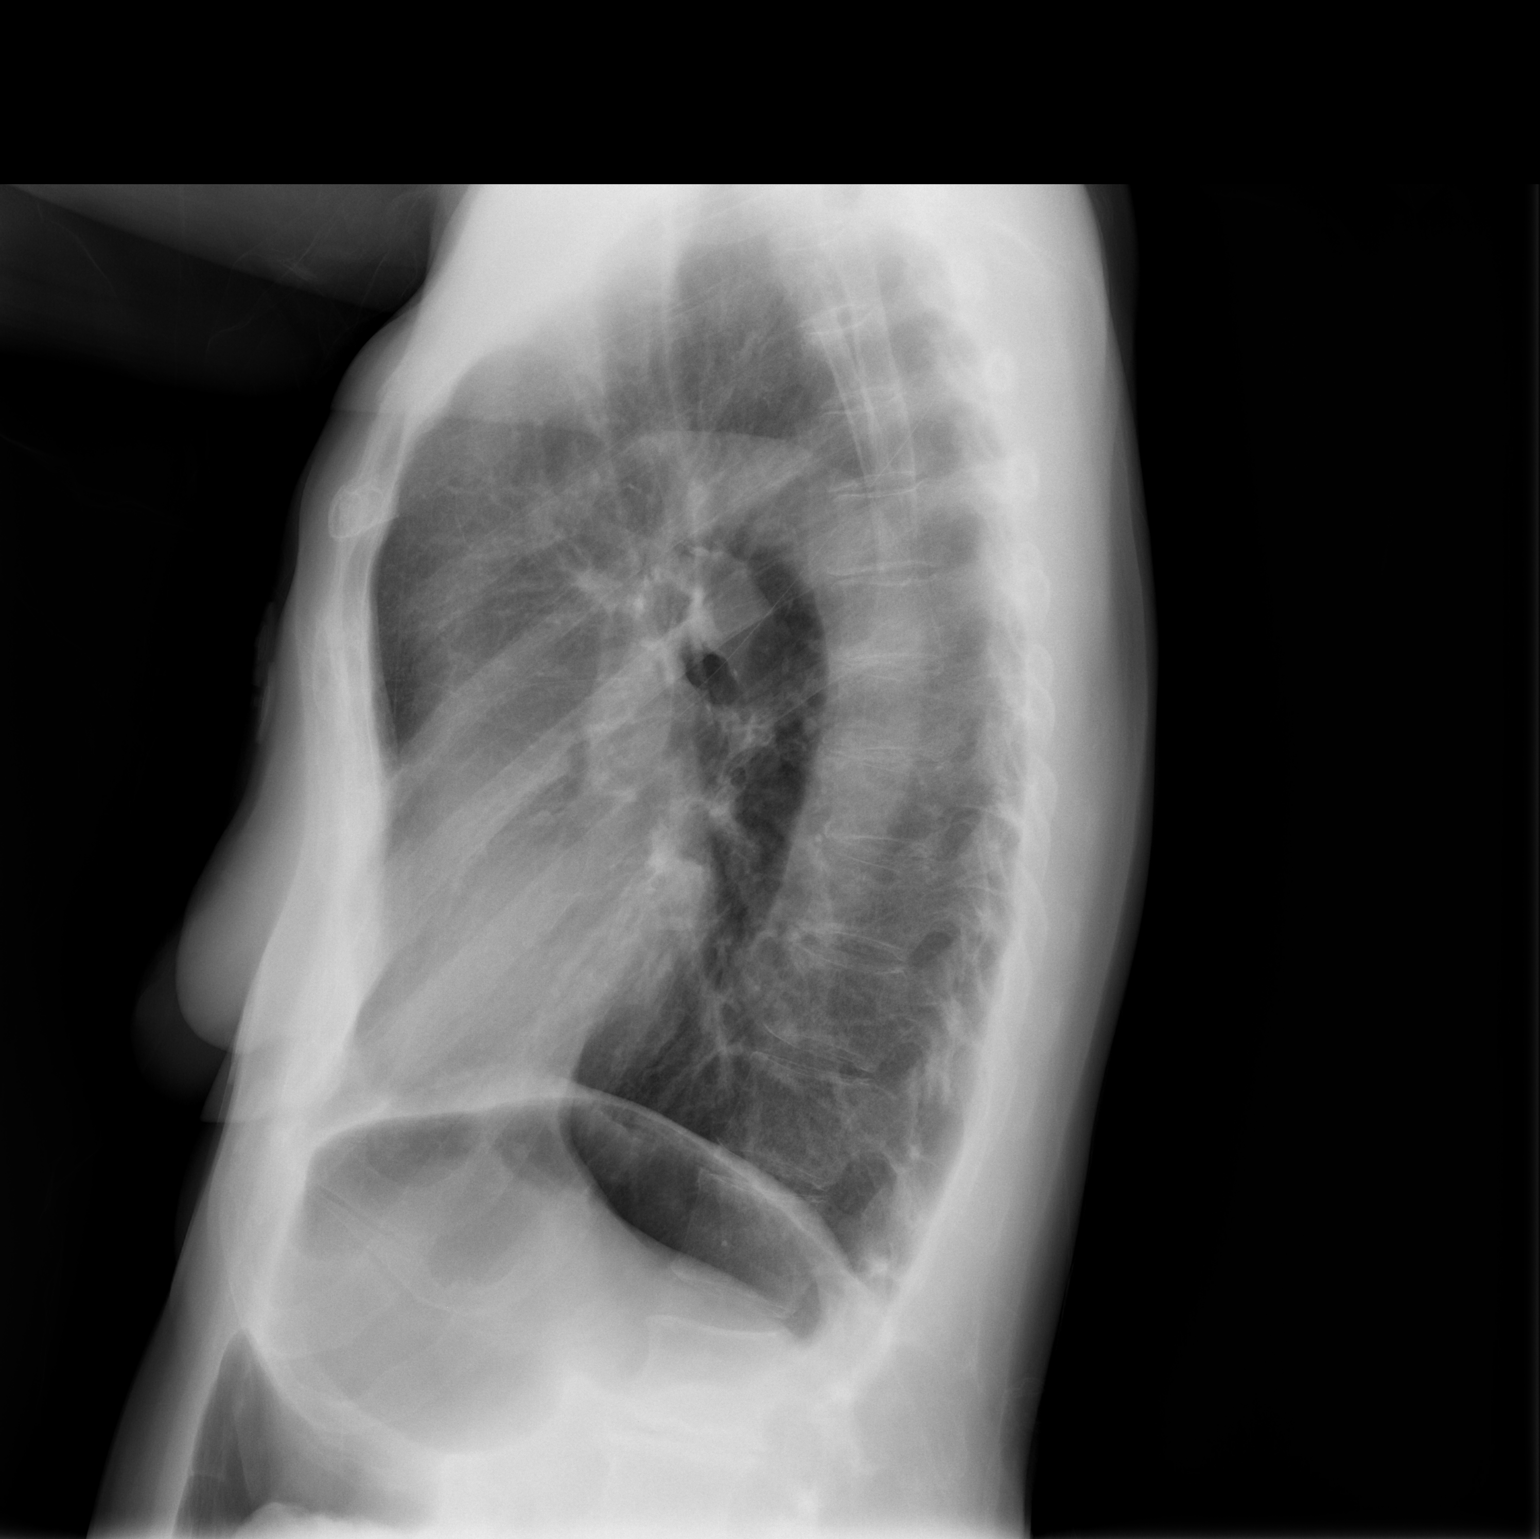

[2 of 2 positions shown; findings below may reference images not displayed]

FINDINGS: Lungs are mildly hyperexpanded but clear. Heart size and pulmonary
vascularity are normal. No adenopathy. No bone lesions.
IMPRESSION: No edema or consolidation.

## 2017-04-05 DIAGNOSIS — G309 Alzheimer's disease, unspecified: Secondary | ICD-10-CM | POA: Diagnosis not present

## 2017-04-05 DIAGNOSIS — I69093 Ataxia following nontraumatic subarachnoid hemorrhage: Secondary | ICD-10-CM | POA: Diagnosis not present

## 2017-04-05 DIAGNOSIS — I69193 Ataxia following nontraumatic intracerebral hemorrhage: Secondary | ICD-10-CM | POA: Diagnosis not present

## 2017-04-05 DIAGNOSIS — F0280 Dementia in other diseases classified elsewhere without behavioral disturbance: Secondary | ICD-10-CM | POA: Diagnosis not present

## 2017-04-05 DIAGNOSIS — F329 Major depressive disorder, single episode, unspecified: Secondary | ICD-10-CM | POA: Diagnosis not present

## 2017-04-05 DIAGNOSIS — I10 Essential (primary) hypertension: Secondary | ICD-10-CM | POA: Diagnosis not present

## 2017-04-07 ENCOUNTER — Inpatient Hospital Stay (HOSPITAL_COMMUNITY)
Admission: EM | Admit: 2017-04-07 | Discharge: 2017-04-12 | DRG: 065 | Disposition: A | Discharge status: Hospice | Payer: Medicare Other | Attending: Neurology | Admitting: Neurology

## 2017-04-07 ENCOUNTER — Encounter (HOSPITAL_COMMUNITY): Payer: Self-pay | Admitting: Emergency Medicine

## 2017-04-07 ENCOUNTER — Emergency Department (HOSPITAL_COMMUNITY): Payer: Medicare Other

## 2017-04-07 DIAGNOSIS — I69951 Hemiplegia and hemiparesis following unspecified cerebrovascular disease affecting right dominant side: Secondary | ICD-10-CM

## 2017-04-07 DIAGNOSIS — Z7982 Long term (current) use of aspirin: Secondary | ICD-10-CM | POA: Diagnosis not present

## 2017-04-07 DIAGNOSIS — F0280 Dementia in other diseases classified elsewhere without behavioral disturbance: Secondary | ICD-10-CM | POA: Diagnosis present

## 2017-04-07 DIAGNOSIS — R29726 NIHSS score 26: Secondary | ICD-10-CM | POA: Diagnosis present

## 2017-04-07 DIAGNOSIS — F419 Anxiety disorder, unspecified: Secondary | ICD-10-CM | POA: Diagnosis not present

## 2017-04-07 DIAGNOSIS — I609 Nontraumatic subarachnoid hemorrhage, unspecified: Secondary | ICD-10-CM | POA: Diagnosis present

## 2017-04-07 DIAGNOSIS — Z8249 Family history of ischemic heart disease and other diseases of the circulatory system: Secondary | ICD-10-CM

## 2017-04-07 DIAGNOSIS — Z66 Do not resuscitate: Secondary | ICD-10-CM | POA: Diagnosis present

## 2017-04-07 DIAGNOSIS — I611 Nontraumatic intracerebral hemorrhage in hemisphere, cortical: Secondary | ICD-10-CM | POA: Diagnosis not present

## 2017-04-07 DIAGNOSIS — I10 Essential (primary) hypertension: Secondary | ICD-10-CM | POA: Diagnosis present

## 2017-04-07 DIAGNOSIS — R251 Tremor, unspecified: Secondary | ICD-10-CM | POA: Diagnosis present

## 2017-04-07 DIAGNOSIS — I612 Nontraumatic intracerebral hemorrhage in hemisphere, unspecified: Principal | ICD-10-CM | POA: Diagnosis present

## 2017-04-07 DIAGNOSIS — G300 Alzheimer's disease with early onset: Secondary | ICD-10-CM | POA: Diagnosis not present

## 2017-04-07 DIAGNOSIS — G309 Alzheimer's disease, unspecified: Secondary | ICD-10-CM | POA: Diagnosis present

## 2017-04-07 DIAGNOSIS — R29810 Facial weakness: Secondary | ICD-10-CM | POA: Diagnosis present

## 2017-04-07 DIAGNOSIS — Z8679 Personal history of other diseases of the circulatory system: Secondary | ICD-10-CM

## 2017-04-07 DIAGNOSIS — Z882 Allergy status to sulfonamides status: Secondary | ICD-10-CM | POA: Diagnosis not present

## 2017-04-07 DIAGNOSIS — I619 Nontraumatic intracerebral hemorrhage, unspecified: Secondary | ICD-10-CM | POA: Diagnosis not present

## 2017-04-07 DIAGNOSIS — I615 Nontraumatic intracerebral hemorrhage, intraventricular: Secondary | ICD-10-CM | POA: Diagnosis present

## 2017-04-07 DIAGNOSIS — R1310 Dysphagia, unspecified: Secondary | ICD-10-CM | POA: Diagnosis present

## 2017-04-07 DIAGNOSIS — E854 Organ-limited amyloidosis: Secondary | ICD-10-CM | POA: Diagnosis present

## 2017-04-07 DIAGNOSIS — Z87891 Personal history of nicotine dependence: Secondary | ICD-10-CM

## 2017-04-07 DIAGNOSIS — I69391 Dysphagia following cerebral infarction: Secondary | ICD-10-CM

## 2017-04-07 DIAGNOSIS — Z90710 Acquired absence of both cervix and uterus: Secondary | ICD-10-CM

## 2017-04-07 DIAGNOSIS — Z888 Allergy status to other drugs, medicaments and biological substances status: Secondary | ICD-10-CM

## 2017-04-07 DIAGNOSIS — R4701 Aphasia: Secondary | ICD-10-CM | POA: Diagnosis present

## 2017-04-07 DIAGNOSIS — Z436 Encounter for attention to other artificial openings of urinary tract: Secondary | ICD-10-CM | POA: Diagnosis not present

## 2017-04-07 DIAGNOSIS — R4182 Altered mental status, unspecified: Secondary | ICD-10-CM | POA: Diagnosis not present

## 2017-04-07 DIAGNOSIS — I6789 Other cerebrovascular disease: Secondary | ICD-10-CM | POA: Diagnosis not present

## 2017-04-07 DIAGNOSIS — R29818 Other symptoms and signs involving the nervous system: Secondary | ICD-10-CM | POA: Diagnosis not present

## 2017-04-07 DIAGNOSIS — Z880 Allergy status to penicillin: Secondary | ICD-10-CM | POA: Diagnosis not present

## 2017-04-07 DIAGNOSIS — J45909 Unspecified asthma, uncomplicated: Secondary | ICD-10-CM | POA: Diagnosis present

## 2017-04-07 DIAGNOSIS — I680 Cerebral amyloid angiopathy: Secondary | ICD-10-CM | POA: Diagnosis present

## 2017-04-07 DIAGNOSIS — Z515 Encounter for palliative care: Secondary | ICD-10-CM | POA: Diagnosis not present

## 2017-04-07 DIAGNOSIS — I69191 Dysphagia following nontraumatic intracerebral hemorrhage: Secondary | ICD-10-CM | POA: Diagnosis not present

## 2017-04-07 DIAGNOSIS — G0490 Encephalitis and encephalomyelitis, unspecified: Secondary | ICD-10-CM | POA: Diagnosis not present

## 2017-04-07 DIAGNOSIS — Z79899 Other long term (current) drug therapy: Secondary | ICD-10-CM | POA: Diagnosis not present

## 2017-04-07 DIAGNOSIS — I639 Cerebral infarction, unspecified: Secondary | ICD-10-CM | POA: Diagnosis not present

## 2017-04-07 DIAGNOSIS — Z887 Allergy status to serum and vaccine status: Secondary | ICD-10-CM

## 2017-04-07 LAB — DIFFERENTIAL
BASOS ABS: 0.1 10*3/uL (ref 0.0–0.1)
Basophils Relative: 1 %
EOS ABS: 0.2 10*3/uL (ref 0.0–0.7)
Eosinophils Relative: 4 %
LYMPHS ABS: 1.2 10*3/uL (ref 0.7–4.0)
Lymphocytes Relative: 20 %
Monocytes Absolute: 0.7 10*3/uL (ref 0.1–1.0)
Monocytes Relative: 11 %
NEUTROS PCT: 64 %
Neutro Abs: 3.9 10*3/uL (ref 1.7–7.7)

## 2017-04-07 LAB — COMPREHENSIVE METABOLIC PANEL
ALBUMIN: 3.7 g/dL (ref 3.5–5.0)
ALT: 23 U/L (ref 14–54)
AST: 27 U/L (ref 15–41)
Alkaline Phosphatase: 62 U/L (ref 38–126)
Anion gap: 9 (ref 5–15)
BUN: 6 mg/dL (ref 6–20)
CHLORIDE: 104 mmol/L (ref 101–111)
CO2: 24 mmol/L (ref 22–32)
Calcium: 8.9 mg/dL (ref 8.9–10.3)
Creatinine, Ser: 0.6 mg/dL (ref 0.44–1.00)
GFR calc Af Amer: >60 mL/min (ref >60)
GFR calc non Af Amer: >60 mL/min (ref >60)
GLUCOSE: 99 mg/dL (ref 65–99)
POTASSIUM: 3.8 mmol/L (ref 3.5–5.1)
SODIUM: 137 mmol/L (ref 135–145)
TOTAL PROTEIN: 6.4 g/dL — AB (ref 6.5–8.1)
Total Bilirubin: 0.9 mg/dL (ref 0.3–1.2)

## 2017-04-07 LAB — I-STAT CHEM 8, ED
BUN: 8 mg/dL (ref 6–20)
CREATININE: 0.6 mg/dL (ref 0.44–1.00)
Calcium, Ion: 1.12 mmol/L — ABNORMAL LOW (ref 1.15–1.40)
Chloride: 101 mmol/L (ref 101–111)
Glucose, Bld: 100 mg/dL — ABNORMAL HIGH (ref 65–99)
HCT: 40 % (ref 36.0–46.0)
Hemoglobin: 13.6 g/dL (ref 12.0–15.0)
POTASSIUM: 3.8 mmol/L (ref 3.5–5.1)
Sodium: 139 mmol/L (ref 135–145)
TCO2: 26 mmol/L (ref 0–100)

## 2017-04-07 LAB — PROTIME-INR
INR: 0.97
Prothrombin Time: 12.8 seconds (ref 11.4–15.2)

## 2017-04-07 LAB — CBG MONITORING, ED: GLUCOSE-CAPILLARY: 95 mg/dL (ref 65–99)

## 2017-04-07 LAB — CBC
HCT: 39.9 % (ref 36.0–46.0)
HEMOGLOBIN: 13.5 g/dL (ref 12.0–15.0)
MCH: 29.8 pg (ref 26.0–34.0)
MCHC: 33.8 g/dL (ref 30.0–36.0)
MCV: 88.1 fL (ref 78.0–100.0)
PLATELETS: 188 10*3/uL (ref 150–400)
RBC: 4.53 MIL/uL (ref 3.87–5.11)
RDW: 12.7 % (ref 11.5–15.5)
WBC: 6.1 10*3/uL (ref 4.0–10.5)

## 2017-04-07 LAB — MRSA PCR SCREENING: MRSA by PCR: NEGATIVE

## 2017-04-07 LAB — I-STAT TROPONIN, ED: Troponin i, poc: 0.01 ng/mL (ref 0.00–0.08)

## 2017-04-07 LAB — ETHANOL: Alcohol, Ethyl (B): <5 mg/dL (ref <5)

## 2017-04-07 LAB — APTT: APTT: 30 s (ref 24–36)

## 2017-04-07 MED ORDER — LEVETIRACETAM 500 MG/5ML IV SOLN
500.0000 mg | Freq: Two times a day (BID) | INTRAVENOUS | Status: DC
  Administered 2017-04-07 – 2017-04-10 (×6): 500 mg via INTRAVENOUS
  Filled 2017-04-07 (×6): qty 5

## 2017-04-07 MED ORDER — ACETAMINOPHEN 160 MG/5ML PO SOLN: 650.0000 mg | ORAL | Status: DC | PRN

## 2017-04-07 MED ORDER — CLEVIDIPINE BUTYRATE 0.5 MG/ML IV EMUL
0.0000 mg/h | INTRAVENOUS | Status: DC
  Administered 2017-04-07: 2 mg/h via INTRAVENOUS
  Administered 2017-04-08: 6 mg/h via INTRAVENOUS
  Administered 2017-04-08: 7 mg/h via INTRAVENOUS
  Administered 2017-04-08: 2 mg/h via INTRAVENOUS
  Administered 2017-04-09: 4 mg/h via INTRAVENOUS
  Administered 2017-04-09: 7 mg/h via INTRAVENOUS
  Administered 2017-04-09 – 2017-04-10 (×3): 4 mg/h via INTRAVENOUS
  Filled 2017-04-07 (×9): qty 50

## 2017-04-07 MED ORDER — LABETALOL HCL 5 MG/ML IV SOLN
INTRAVENOUS | Status: AC
Start: 2017-04-07 — End: 2017-04-07
  Administered 2017-04-07: 20 mg via INTRAVENOUS
  Filled 2017-04-07: qty 4

## 2017-04-07 MED ORDER — LABETALOL HCL 5 MG/ML IV SOLN: 20.0000 mg | Freq: Once | INTRAVENOUS | Status: AC

## 2017-04-07 MED ORDER — ACETAMINOPHEN 325 MG PO TABS: 650.0000 mg | ORAL_TABLET | ORAL | Status: DC | PRN

## 2017-04-07 MED ORDER — CLEVIDIPINE BUTYRATE 0.5 MG/ML IV EMUL
1.0000 mg/h | INTRAVENOUS | Status: DC
  Administered 2017-04-07: 1 mg/h via INTRAVENOUS
  Filled 2017-04-07: qty 50

## 2017-04-07 MED ORDER — LABETALOL HCL 5 MG/ML IV SOLN
20.0000 mg | Freq: Once | INTRAVENOUS | Status: AC
  Administered 2017-04-07: 20 mg via INTRAVENOUS

## 2017-04-07 MED ORDER — PANTOPRAZOLE SODIUM 40 MG IV SOLR
40.0000 mg | Freq: Every day | INTRAVENOUS | Status: DC
  Administered 2017-04-07 – 2017-04-09 (×3): 40 mg via INTRAVENOUS
  Filled 2017-04-07 (×3): qty 40

## 2017-04-07 MED ORDER — SENNOSIDES-DOCUSATE SODIUM 8.6-50 MG PO TABS: 1.0000 | ORAL_TABLET | Freq: Two times a day (BID) | ORAL | Status: DC

## 2017-04-07 MED ORDER — SODIUM CHLORIDE 0.9 % IV SOLN
INTRAVENOUS | Status: DC
  Administered 2017-04-07 – 2017-04-10 (×4): via INTRAVENOUS

## 2017-04-07 MED ORDER — STROKE: EARLY STAGES OF RECOVERY BOOK
Freq: Once | Status: AC
  Administered 2017-04-07: 1
  Filled 2017-04-07: qty 1

## 2017-04-07 MED ORDER — ACETAMINOPHEN 650 MG RE SUPP
650.0000 mg | RECTAL | Status: DC | PRN
  Administered 2017-04-12: 650 mg via RECTAL
  Filled 2017-04-07: qty 1

## 2017-04-07 NOTE — H&P (Addendum)
Neurology H&P  CC: Left-sided weakness  History is obtained from: Family  HPI: Kayla Hays is a 76 y.o. female with a history of recent [place] presents with recurrent ICH. She was last known well definitely sometime around 10 AM, when checked on around 1:30 PM she was seen to have left-sided weakness. It is possible that she was seen between these 2 timeframes, but it was not family but rather caregivers who are with her and they are not here to ask. She presented with right-sided weakness and aphasia, was taken for a stat head CT per her coach stroke protocol. There are a large ICH was seen.  At baseline, she has end-stage dementia and that she was at home as 24-hour care.  LKW: 10 AM tpa given?: no, ICH ICH Score: 2   ROS:  Unable to obtain due to altered mental status.   Past Medical History:  Diagnosis Date  . Anxiety   . Asthma   . Hypertension   . PVC's (premature ventricular contractions)      Family History  Problem Relation Age of Onset  . Breast cancer Mother   . Heart disease Maternal Grandfather   . Cancer Paternal Grandfather      Social History:  reports that she quit smoking about 28 years ago. She has never used smokeless tobacco. She reports that she drinks alcohol. She reports that she does not use drugs.   Exam: Current vital signs: BP (!) 178/91   Pulse 76   Temp 99.1 F (37.3 C) (Oral)   Resp 14   Ht 5\' 7"  (1.702 m)   Wt 55.3 kg (121 lb 14.6 oz)   SpO2 100%   BMI 19.09 kg/m  Vital signs in last 24 hours: Temp:  [99.1 F (37.3 C)] 99.1 F (37.3 C) (06/20 1625) Pulse Rate:  [72-77] 76 (06/20 1604) Resp:  [12-19] 14 (06/20 1604) BP: (155-187)/(70-102) 178/91 (06/20 1604) SpO2:  [100 %] 100 % (06/20 1604) Weight:  [55.3 kg (121 lb 14.6 oz)-57.4 kg (126 lb 8.7 oz)] 55.3 kg (121 lb 14.6 oz) (06/20 1625)  Physical Exam  Constitutional: Appears Elderly Psych: Affect appropriate to situation Eyes: No scleral injection HENT: No OP  obstrucion Head: Normocephalic.  Cardiovascular: Normal rate and regular rhythm.  Respiratory: Effort normal and breath sounds normal to anterior ascultation GI: Soft.  No distension. There is no tenderness.  Skin: WDI  Neuro: Mental Status: Patient is mildly somnolent, she does not follow commands. Cranial Nerves: II: No blink to threat from the right, does blink from the left. Pupils are equal, round, and reactive to light.   III,IV, VI: Right gaze preference V: Responds to stim bilaterally VII: Facial movement is weak on the right VIII: She orients toward loud noise Motor: She has 2/5 weakness of the right arm and leg, mostly left side well Sensory: She responds less to noxious stimuli on the right than the left Cerebellar: She does not perform   I have reviewed labs in epic and the results pertinent to this consultation are: CMP-unremarkable CBC-normal INR-0.97  I have reviewed the images obtained: CT head-large intraparenchymal hematoma on the left  Impression: 76 year old female with severe dementia now with recurrent ICH. After discussions with family have made her DO NOT RESUSCITATE. She is admitted to the ICU for aggressive blood pressure control. With recurrent hemorrhages I do wonder about amyloid angiopathy, though this was not definite by the last MRI.  Recommendations: 1) Admit to ICU 2) no antiplatelets  or anticoagulants 3) blood pressure control with goal systolic 736 - 681 4) Frequent neuro checks 5) If symptoms worsen or there is decreased mental status, repeat stat head CT 6) PT,OT,ST   This patient is critically ill and at significant risk of neurological worsening, death and care requires constant monitoring of vital signs, hemodynamics,respiratory and cardiac monitoring, neurological assessment, discussion with family, other specialists and medical decision making of high complexity. I spent 60 minutes of neurocritical care time  in the care of  this  patient.  Roland Rack, MD [facility] [phone]  If 7pm- 7am, please page neurology on call as listed in [place]. 04/07/2017  6:30 PM

## 2017-04-07 NOTE — Progress Notes (Signed)
[facility] Progress Note Patient Name: Kayla Hays DOB: 1940/11/13 MRN: [id number]   Date of Service  04/07/2017  HPI/Events of Note  Large left parietal ICH with IV extension BP controlled  eICU Interventions  none     Intervention Category Evaluation Type: New Patient Evaluation  Shrihan Putt V. 04/07/2017, 4:43 PM

## 2017-04-07 NOTE — ED Triage Notes (Signed)
Pt arrives as code stroke, met at [facility] and taken straight to CT. Pt LSN 1330, became non verbal doesn't follow commands, right sided facial droop, drift in right arm and leg.   

## 2017-04-07 NOTE — ED Provider Notes (Addendum)
[facility] DEPT Provider Note   CSN: [id number] Arrival date & time: 04/07/17  1528     History   Chief Complaint Chief Complaint  Patient presents with  . Code Stroke    HPI Kayla Hays is a 76 y.o. female.  HPI  76 year old female with a history of hypertension and recent left frontal hemorrhagic stroke in April presents to the ED with sudden onset of right facial droop, right-sided weakness, left-sided gaze. Last known normal 1330. Per EMS patient's blood pressure was elevated with systolics in the 740C. No seizure activity. No anticoagulation. The patient is noncommunicative.. Code stroke was initiated in route.  Remainder of history, ROS, and physical exam limited due to patient's condition (AMS). Additional information was obtained from EMS.  Level V Caveat.   Past Medical History:  Diagnosis Date  . Anxiety   . Asthma   . Hypertension   . PVC's (premature ventricular contractions)     Patient Active Problem List   Diagnosis Date Noted  . ICH (intracerebral hemorrhage) ([place]) 04/07/2017  . PVC's (premature ventricular contractions) 05/18/2013  . Essential hypertension 05/18/2013  . Memory loss 05/18/2013    Past Surgical History:  Procedure Laterality Date  . ABDOMINAL HYSTERECTOMY    . NO PAST SURGERIES      OB History    No data available       Home Medications    Prior to Admission medications   Medication Sig Start Date End Date Taking? Authorizing Provider  albuterol (PROVENTIL HFA;VENTOLIN HFA) 108 (90 Base) MCG/ACT inhaler Inhale 1-2 puffs into the lungs every 4 (four) hours as needed for wheezing or shortness of breath. 09/06/16   Blanchie Dessert, MD  aspirin 81 MG tablet Take 81 mg by mouth.     [provider]  azithromycin (ZITHROMAX) 250 MG tablet Take 1 tablet (250 mg total) by mouth daily. Take first 2 tablets together, then 1 every day until finished. 09/06/16   Blanchie Dessert, MD  cetirizine (ZYRTEC) 10 MG tablet  Take 10 mg by mouth daily.    [provider]  Dietary Management Product (ENLYTE) CAPS Take 1 capsule by mouth daily.    [provider]  FLOVENT HFA 44 MCG/ACT inhaler Inhale 1 puff into the lungs as needed (FOR SHORTNESS OF BREATH AND WHEEZING).  05/10/14   [provider]  lisinopril (PRINIVIL,ZESTRIL) 5 MG tablet Take 1 tablet (5 mg total) by mouth daily. NEED OV. 02/11/16   Richardson Dopp T, PA-C  loperamide (IMODIUM A-D) 2 MG tablet TAKE 1/2 TO TABLET DAILY AS NEEDED FOR DIARRHEA    [provider]  metoprolol succinate (TOPROL-XL) 50 MG 24 hr tablet Take 1 tablet (50 mg total) by mouth daily. Take with or immediately following a meal. 08/13/15   Lorretta Harp, MD  Multiple Vitamin (MULTIVITAMIN) capsule Take 1 capsule by mouth daily.    [provider]  predniSONE (DELTASONE) 20 MG tablet Take 2 tablets (40 mg total) by mouth daily. 09/06/16   Blanchie Dessert, MD    Family History Family History  Problem Relation Age of Onset  . Breast cancer Mother   . Heart disease Maternal Grandfather   . Cancer Paternal Grandfather     Social History Social History  Substance Use Topics  . Smoking status: Former Smoker    Quit date: 05/18/1988  . Smokeless tobacco: Never Used  . Alcohol use Yes     Comment: occassional     Allergies   Tetanus  toxoids; Theophyllines; Penicillins; Rivastigmine tartrate; and Sulfa antibiotics   Review of Systems Review of Systems  Unable to perform ROS: Acuity of condition     Physical Exam Updated Vital Signs BP (!) 187/91   Pulse 77   Resp 12   Wt 57.4 kg (126 lb 8.7 oz)   BMI 19.24 kg/m   Physical Exam  Constitutional: She appears well-developed and well-nourished. She is uncooperative. No distress.  HENT:  Head: Normocephalic and atraumatic.  Nose: Nose normal.  Eyes: Conjunctivae and EOM are normal. Pupils are equal, round, and reactive to light. Right eye exhibits no discharge. Left eye  exhibits no discharge. No scleral icterus.  Neck: Normal range of motion. Neck supple.  Cardiovascular: Normal rate and regular rhythm.  Exam reveals no gallop and no friction rub.   No murmur heard. Pulmonary/Chest: Effort normal and breath sounds normal. No stridor. No respiratory distress. She has no rales.  Abdominal: Soft. She exhibits no distension. There is no tenderness.  Musculoskeletal: She exhibits no edema or tenderness.  Neurological: She is alert.  Left-sided gaze. Right-sided neglect. Right-sided extremity weakness. Uncooperative with neuro exam.  Skin: Skin is warm and dry. No rash noted. She is not diaphoretic. No erythema.  Psychiatric: She has a normal mood and affect.  Vitals reviewed.    ED Treatments / Results  Labs (all labs ordered are listed, but only abnormal results are displayed) Labs Reviewed  I-STAT CHEM 8, ED - Abnormal; Notable for the following:       Result Value   Glucose, Bld 100 (*)    Calcium, Ion 1.12 (*)    All other components within normal limits  PROTIME-INR  APTT  CBC  DIFFERENTIAL  ETHANOL  COMPREHENSIVE METABOLIC PANEL  RAPID URINE DRUG SCREEN, HOSP PERFORMED  URINALYSIS, ROUTINE W REFLEX MICROSCOPIC  I-STAT TROPOININ, ED  CBG MONITORING, ED    EKG  EKG Interpretation None       Radiology Ct Head Code Stroke W/o Cm  Result Date: 04/07/2017 CLINICAL DATA:  Code stroke. 76 year old female with right facial droop and weakness. Left frontal lobe parenchymal hemorrhage in April. EXAM: CT HEAD WITHOUT CONTRAST TECHNIQUE: Contiguous axial images were obtained from the base of the skull through the vertex without intravenous contrast. COMPARISON:  Head CT 03/19/2017 and earlier. FINDINGS: Brain: Recurrent acute hyperdense hemorrhage within the left hemisphere. Hemorrhage epicenter today is the anterior aspect of the left frontal operculum and insula. Intra-axial blood products encompass 51 x 54 x 55 mm (AP by transverse by CC), for  an estimated parenchymal blood volume of 76 mL. Surrounding edema. Regional mass effect. Extension into the left lateral ventricle at the body of the left caudate. Lateral and third intraventricular hemorrhage, moderate volume. Regional mass effect in the left hemisphere with partial effacement of the left lateral ventricle, However, no definite acute ventriculomegaly. The fourth ventricle and basilar cisterns are spared. No subarachnoid or subdural extension identified. Underlying anterior left frontal lobe encephalomalacia corresponding to the more anteriorly located intra-axial hemorrhage earlier this year. No superimposed acute cortically based infarct identified. Right hemisphere and posterior fossa gray-white matter differentiation appears stable. Vascular: Mild Calcified atherosclerosis at the skull base. Skull: Osteopenia.  No acute osseous abnormality identified. Sinuses/Orbits: Visualized paranasal sinuses and mastoids are stable and well pneumatized. Other: No acute orbit or scalp soft tissue findings. Left gaze deviation. ASPECTS [facility] Stroke Program Early CT Score) Not applicable, acute hemorrhage. IMPRESSION: 1. Recurrent acute parenchymal hemorrhage in the left hemisphere. Epicenter today  is the left frontal operculum. Intra-axial blood volume estimated at 76 mL. 2. Moderate intraventricular extension of hemorrhage, but no acute ventriculomegaly. 3. Regional mass effect in the left hemisphere but no midline shift or loss of basilar cisterns at this time. 4. ASPECTS is not applicable, acute hemorrhage. 5. Dr. Roland Rack was paged regarding the above critical value on 04/07/2017 at 1545 hours. Electronically Signed   By: Genevie Ann M.D.   On: 04/07/2017 15:47    Procedures Procedures (including critical care time)  CRITICAL CARE Performed by: Grayce Sessions Lesha Jager Total critical care time: 30 minutes Critical care time was exclusive of separately billable procedures and treating other  patients. Critical care was necessary to treat or prevent imminent or life-threatening deterioration. Critical care was time spent personally by me on the following activities: development of treatment plan with patient and/or surrogate as well as nursing, discussions with consultants, evaluation of patient's response to treatment, examination of patient, obtaining history from patient or surrogate, ordering and performing treatments and interventions, ordering and review of laboratory studies, ordering and review of radiographic studies, pulse oximetry and re-evaluation of patient's condition.   Medications Ordered in ED Medications  clevidipine (CLEVIPREX) infusion 0.5 mg/mL (1 mg/hr Intravenous New Bag/Given 04/07/17 1552)  labetalol (NORMODYNE,TRANDATE) injection 20 mg (not administered)  labetalol (NORMODYNE,TRANDATE) 5 MG/ML injection (20 mg  Given 04/07/17 1550)     Initial Impression / Assessment and Plan / ED Course  I have reviewed the triage vital signs and the nursing notes.  Pertinent labs & imaging results that were available during my care of the patient were reviewed by me and considered in my medical decision making (see chart for details).     Code stroke initiated in the field. CT head revealed a left parietal lobe hemorrhagic stroke with interventricular extension. No airway compromise or respiratory distress. Neurology at bedside to evaluate the patient and will admit for further management.   Labs grossly reassuring.  Family declined any surgical intervention at this time.  Will be admitted to neuro ICU under the care of neurology   Final Clinical Impressions(s) / ED Diagnoses   Final diagnoses:  Hemorrhagic stroke [facility])        Harding Thomure, Grayce Sessions, MD 04/07/17 1645

## 2017-04-07 NOTE — Progress Notes (Signed)
Code stroke called at 1514, Patient arrived to [facility] ED via 1528 via [facility]. LSN 1330 when she suddenly developed aphasia, forced gaze and facial droop.  Patient has a history of stroke in April 2018 with no residuals.  Patient was hypertensive in the field 190's/100's.  Labetalol given

## 2017-04-07 NOTE — Progress Notes (Signed)
Pt arrived on unit hypertensive. Cleviprex infusing. Rate adjusted. Will continue to monitor and update as needed.

## 2017-04-08 ENCOUNTER — Inpatient Hospital Stay (HOSPITAL_COMMUNITY): Payer: Medicare Other

## 2017-04-08 DIAGNOSIS — I611 Nontraumatic intracerebral hemorrhage in hemisphere, cortical: Secondary | ICD-10-CM

## 2017-04-08 LAB — BASIC METABOLIC PANEL
ANION GAP: 8 (ref 5–15)
BUN: 7 mg/dL (ref 6–20)
CALCIUM: 9.1 mg/dL (ref 8.9–10.3)
CO2: 26 mmol/L (ref 22–32)
Chloride: 100 mmol/L — ABNORMAL LOW (ref 101–111)
Creatinine, Ser: 0.5 mg/dL (ref 0.44–1.00)
Glucose, Bld: 143 mg/dL — ABNORMAL HIGH (ref 65–99)
Potassium: 3.6 mmol/L (ref 3.5–5.1)
Sodium: 134 mmol/L — ABNORMAL LOW (ref 135–145)

## 2017-04-08 LAB — CBC
HCT: 40.1 % (ref 36.0–46.0)
Hemoglobin: 13.5 g/dL (ref 12.0–15.0)
MCH: 29.6 pg (ref 26.0–34.0)
MCHC: 33.7 g/dL (ref 30.0–36.0)
MCV: 87.9 fL (ref 78.0–100.0)
PLATELETS: 214 10*3/uL (ref 150–400)
RBC: 4.56 MIL/uL (ref 3.87–5.11)
RDW: 12.8 % (ref 11.5–15.5)
WBC: 11.8 10*3/uL — AB (ref 4.0–10.5)

## 2017-04-08 LAB — MAGNESIUM: Magnesium: 1.9 mg/dL (ref 1.7–2.4)

## 2017-04-08 LAB — PHOSPHORUS: PHOSPHORUS: 3 mg/dL (ref 2.5–4.6)

## 2017-04-08 LAB — GLUCOSE, CAPILLARY: Glucose-Capillary: 135 mg/dL — ABNORMAL HIGH (ref 65–99)

## 2017-04-08 MED ORDER — LISINOPRIL 5 MG PO TABS: 5.0000 mg | ORAL_TABLET | Freq: Every day | ORAL | Status: DC

## 2017-04-08 MED ORDER — DONEPEZIL HCL 10 MG PO TABS: 10.0000 mg | ORAL_TABLET | Freq: Every day | ORAL | Status: DC

## 2017-04-08 MED ORDER — METOPROLOL SUCCINATE ER 50 MG PO TB24: 50.0000 mg | ORAL_TABLET | Freq: Every day | ORAL | Status: DC

## 2017-04-08 MED ORDER — LISINOPRIL 20 MG PO TABS: 20.0000 mg | ORAL_TABLET | Freq: Every day | ORAL | Status: DC

## 2017-04-08 MED ORDER — VITAL HIGH PROTEIN PO LIQD: 1000.0000 mL | ORAL | Status: DC

## 2017-04-08 MED ORDER — LABETALOL HCL 5 MG/ML IV SOLN
5.0000 mg | INTRAVENOUS | Status: DC | PRN
  Administered 2017-04-08: 5 mg via INTRAVENOUS
  Filled 2017-04-08: qty 4

## 2017-04-08 MED ORDER — PRO-STAT SUGAR FREE PO LIQD: 30.0000 mL | Freq: Two times a day (BID) | ORAL | Status: DC

## 2017-04-08 MED ORDER — ESCITALOPRAM OXALATE 10 MG PO TABS: 20.0000 mg | ORAL_TABLET | Freq: Every day | ORAL | Status: DC

## 2017-04-08 NOTE — Progress Notes (Signed)
OT Cancellation Note  Patient Details Name: Kayla Hays MRN: [id number] DOB: [birth year]   Cancelled Treatment:    Reason Eval/Treat Not Completed: Patient not medically ready. Pt on bedrest. Please update activity orders when appropriate for therapy. Thanks  [place], OT/L  [phone] 04/08/2017 04/08/2017, 7:15 AM

## 2017-04-08 NOTE — Progress Notes (Signed)
EEG Completed; Results Pending  

## 2017-04-08 NOTE — Care Management Note (Signed)
Case Management Note  Patient Details  Name: Kayla Hays MRN: [id number] Date of Birth: 11/30/1940  Subjective/Objective:   Pt admitted on 04/07/17 with recurrent ICH.  PTA, pt resided at home with 24h caregivers.  Pt has end-stage dementia per H&P.      Action/Plan: PT/OT evaluations pending.  Will follow for discharge planning as pt progresses.    Expected Discharge Date:                  Expected Discharge Plan:  [place]  In-House Referral:     Discharge planning Services  CM Consult  Post Acute Care Choice:    Choice offered to:     DME Arranged:    DME Agency:     HH Arranged:    [place] Agency:     Status of Service:  In process, will continue to follow  If discussed at Long Length of Stay Meetings, dates discussed:    Additional Comments:  Reinaldo Raddle, RN, BSN  Trauma/Neuro ICU Case Manager [phone]

## 2017-04-08 NOTE — Procedures (Signed)
History: 76 year old female with frontal hemorrhage  Sedation: None  Technique: This is a 21 channel routine scalp EEG performed at the bedside with bipolar and monopolar montages arranged in accordance to the international 10/20 system of electrode placement. One channel was dedicated to EKG recording.    Background: The background consists of generalized irregular slow activity. There is a posterior dominant rhythm of 7-8 Hz which is poorly sustained and poorly organized. There is a focal prominence of slow activity, predominantly theta in the right frontotemporal region  Photic stimulation: Physiologic driving is not performed  EEG Abnormalities: 1) right frontotemporal slow activity 2) generalized irregular slow activity 3) slow PDR  Clinical Interpretation: This EEG is consistent with a generalized non-specific cerebral dysfunction(encephalopathy) with evidence also of a superimposed right frontotemporal cerebral dysfunction. There was no seizure or seizure predisposition recorded on this study.   Roland Rack, MD [facility] [phone]  If 7pm- 7am, please page neurology on call as listed in [place].

## 2017-04-08 NOTE — Progress Notes (Signed)
STROKE TEAM PROGRESS NOTE   HISTORY OF PRESENT ILLNESS (per record) Kayla Hays is a 76 y.o. female with a history of Alzheimer's dementia, hypertension, PVCs, and a recent [place] who presents with recurrent ICH.  At baseline, she has end-stage dementia and that she was at home with 24-hour care. She was last known well around 10 AM on 04/07/2017, when checked on around 1:30 PM she was seen to have left-sided weakness.  She presented with right-sided weakness and aphasia, and was taken for a stat head CT per the code stroke protocol. The CT head showed an acute recurrent left frontal parenchymal hemorrhage with moderate extension into the left ventricle, with mass effect, and no midline shift.    LKW: 10 AM ICH Score: 2  Patient was not administered IV t-PA secondary to intracranial hemorrhage. She was admitted to the neuro ICU for further evaluation and treatment.   SUBJECTIVE (INTERVAL HISTORY) Her daughter and in-home caregiver are at the bedside.  The patient is obtunded, and does not open her eyes with noxious stimuli.  She has coarse tremor in her left arm that her daughter states first appeared after her recent [place] in 01/2017, but then resolved over time. The patient has markedly increased muscle tone in her left arm.  Per the daughter present, the patient has a living will, and her siblings (brother and sister) will arrive today to discuss goals of care.   OBJECTIVE Temp:  [98.1 F (36.7 C)-99.3 F (37.4 C)] 98.1 F (36.7 C) (06/21 0800) Pulse Rate:  [71-99] 99 (06/21 1000) Resp:  [12-26] 19 (06/21 1000) BP: (79-187)/(56-140) 121/63 (06/21 1000) SpO2:  [97 %-100 %] 98 % (06/21 1000) Weight:  [55.3 kg (121 lb 14.6 oz)-57.4 kg (126 lb 8.7 oz)] 55.3 kg (121 lb 14.6 oz) (06/20 1625)  CBC:  Recent Labs Lab 04/07/17 1530 04/07/17 1537 04/08/17 0806  WBC 6.1  --  11.8*  NEUTROABS 3.9  --   --   HGB 13.5 13.6 13.5  HCT 39.9 40.0 40.1  MCV 88.1  --  87.9  PLT 188  --  214     Basic Metabolic Panel:  Recent Labs Lab 04/07/17 1530 04/07/17 1537 04/08/17 0806  NA 137 139 134*  K 3.8 3.8 3.6  CL 104 101 100*  CO2 24  --  26  GLUCOSE 99 100* 143*  BUN 6 8 7   CREATININE 0.60 0.60 0.50  CALCIUM 8.9  --  9.1    Lipid Panel: No results found for: CHOL, TRIG, HDL, CHOLHDL, VLDL, LDLCALC HgbA1c: No results found for: HGBA1C Urine Drug Screen: No results found for: LABOPIA, COCAINSCRNUR, LABBENZ, AMPHETMU, THCU, LABBARB  Alcohol Level     Component Value Date/Time   ETH <5 04/07/2017 1532    IMAGING I have personally reviewed the radiological images below and agree with the radiology interpretations.  Ct Head Code Stroke W/o Cm 04/07/2017 IMPRESSION: 1. Recurrent acute parenchymal hemorrhage in the left hemisphere. Epicenter today is the left frontal operculum. Intra-axial blood volume estimated at 76 mL. 2. Moderate intraventricular extension of hemorrhage, but no acute ventriculomegaly. 3. Regional mass effect in the left hemisphere but no midline shift or loss of basilar cisterns at this time. 4. ASPECTS is not applicable, acute hemorrhage.  MR Brain wo Contrast 04/08/2017 Recurrent left frontal lobe hematoma is stable. Intraventricular hemorrhage stable. 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 Smaller areas of hemorrhage in the right frontal lobe and left parietal lobe. Subarachnoid hemorrhage. Findings are  suggestive of cerebral amyloid.  TTE pending  EEG pending   PHYSICAL EXAM  Temp:  [98.1 F (36.7 C)-99.5 F (37.5 C)] 99.5 F (37.5 C) (06/21 1536) Pulse Rate:  [71-108] 108 (06/21 1200) Resp:  [13-26] 18 (06/21 1100) BP: (79-178)/(56-140) 138/83 (06/21 1200) SpO2:  [96 %-100 %] 96 % (06/21 1200) Weight:  [121 lb 14.6 oz (55.3 kg)] 121 lb 14.6 oz (55.3 kg) (06/20 1625)  General - Well nourished, well developed, obtunded.  Ophthalmologic - Fundi not visualized due to  noncooperation.  Cardiovascular - Regular rate and rhythm.  Neuro - obtunded, not open eyes on voice or pain, not following commands, nonverbal. PERRL, positive corneal and gag, positive doll's eyes, not blinking to visual threat bilaterally, breathing normally, right facial droop. On pain stimulation, left UE purposeful and localizing to pain, RUE trace withdraw on pain, LLE spontaneous movement and against gravity, RLE withdraw to pain but not able to against gravity. LUE increased tone with gegenhalten. DTR 3+ BUEs. No babinski. Sensation, coordination and gait not tested.   ASSESSMENT/PLAN Kayla Hays is a 76 y.o. female with history of Alzheimer's dementia, hypertension, PVCs, and a recent [place] who presents with recurrent ICH. She did not receive IV t-PA due to [place].   Stroke:  acute recurrent left frontal ICH with moderate extension into the left ventricle and mass effect. 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 Of note, pt does have hx of HTN but felt less likely in this scenario.   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 Due to concerning for CAA, recommend to avoid antithrombotics as outpt  Ongoing aggressive stroke risk factor management  Therapy recommendations:  pending  Disposition:  Pending  Left UE coarse tremor  Doubt for seizure but will do EEG  As per daughter, pt had it with her first [place] in 01/2017  Continue monitoring  Hx of ICH and [facility]  01/2017 left  frontal ICH - treated in [place] with NSG and steroids - sent to rehab for 3 weeks and then back home with 24h caregiver  03/19/17 repeat CT showed left frontal small multifocal SAH   With current recurrent left frontal cortical ICH, the process suggesting CAA  AD  Diagnosed 5 years ago  On aricept  Baseline before first ICH 80% lucid and 20% confusion. 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  Hypertension  Unstable  SBP < 140 to prevent hematoma expansion  Clevidipine gtt for SBP < 140, MAP 75  Long-term BP goal normotensive  Dysphagia  Failed swallow  Will initiate NG tube for tube feeding and BP meds  Speech to follow  Other Stroke Risk Factors  Advanced age   ETOH use, advised to drink no more than 1 drink(s) a day  Other Active Problems  Asthma  DNR/DNI status  Hospital day # 1  This patient is critically ill due to recurrent frontal ICH, hx of ICH,  hx of SAH, AD, dysphagia and at significant risk of neurological worsening, death form recurrent ICH, seizure, hydrocephalus, aspiration, sepsis, heart failure. This patient's care requires constant monitoring of vital signs, hemodynamics, respiratory and cardiac monitoring, review of multiple databases, neurological assessment, discussion with family, other specialists and medical decision making of high complexity. I spent 45 minutes of neurocritical care time in the care of this patient. I had long discussion with daughter at bedside, updated pt current condition, treatment plan and potential prognosis. She expressed understanding and appreciation.   Rosalin Hawking, MD PhD Stroke Neurology 04/08/2017 4:05 PM   To contact Stroke Continuity provider, please refer to [email or web address] After hours, contact General Neurology

## 2017-04-08 NOTE — Progress Notes (Signed)
PT Cancellation Note  Patient Details Name: Kayla Hays MRN: [id number] DOB: 24-Jun-1941   Cancelled Treatment:    Reason Eval/Treat Not Completed: Patient not medically ready. Pt currently on bedrest. Will continue to follow and complete PT eval when medically appropriate.    Thelma Comp 04/08/2017, 8:43 AM  Rolinda Roan, PT, DPT Acute Rehabilitation Services Pager: [phone]

## 2017-04-08 NOTE — ED Notes (Addendum)
Report called to 4N RN pt transported by this RN.

## 2017-04-08 NOTE — Progress Notes (Signed)
Tube Feed and po meds noted in chart. No cortrack service today. Family brought pts MOST and Advanced Directives in. Dr Erlinda Hong will review these tomorrow with family before inserting cortrack. Tube feeds and po meds on hold for now.

## 2017-04-08 NOTE — Evaluation (Signed)
Clinical/Bedside Swallow Evaluation Patient Details  Name: Kayla Hays MRN: [id number] Date of Birth: 10-30-1940  Today's Date: 04/08/2017 Time: SLP Start Time (ACUTE ONLY): 0945 SLP Stop Time (ACUTE ONLY): 0955 SLP Time Calculation (min) (ACUTE ONLY): 10 min  Past Medical History:  Past Medical History:  Diagnosis Date  . Anxiety   . Asthma   . Hypertension   . PVC's (premature ventricular contractions)    Past Surgical History:  Past Surgical History:  Procedure Laterality Date  . ABDOMINAL HYSTERECTOMY    . NO PAST SURGERIES     HPI:  Pt is a 76 year old female who presented with R sided weakness and aphasia. 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 Per caregiver/daughter, pt had only mild cognitive impairment prior to [place] in March of this year. After that time she had no difficulties with swallowing and communicaiton, but she needed full supervision at home. PMH includes dementia, HTN, asthma, anxiety   Assessment / Plan / Recommendation Clinical Impression  Pt does not make any attempts to actively accept POs at this time. SLP provided Total A for labial parting to provide oral care, at which time secretions started spilling out from the R side of her mouth where they appeared to have been pooling in her buccal cavity. Minimal bleeding occurred with light oral care (pt would not release her teeth to allow cleaning of her tongue). Recommend to remain NPO for now. Will f/u on next date to assess for PO readiness pending improved cognitive-linguistic function. SLP Visit Diagnosis: Dysphagia, oral phase (R13.11)    Aspiration Risk  Severe aspiration risk    Diet Recommendation NPO   Medication Administration: Via alternative means    Other  Recommendations Oral Care Recommendations: Oral care QID Other Recommendations: Have oral suction available   Follow up Recommendations  (tba)      Frequency and Duration min 3x week  2 weeks       Prognosis  Prognosis for Safe Diet Advancement: Fair Barriers to Reach Goals: Cognitive deficits;Language deficits;Severity of deficits      Swallow Study   General HPI: Pt is a 76 year old female who presented with R sided weakness and aphasia. 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 Per caregiver/daughter, pt had only mild cognitive impairment prior to [place] in March of this year. After that time she had no difficulties with swallowing and communicaiton, but she needed full supervision at home. PMH includes dementia, HTN, asthma, anxiety Type of Study: Bedside Swallow Evaluation Previous Swallow Assessment: none in chart - per caregiver, she was initiated on a pureed diet and OSF following prior stroke, but was then advanced to a regular diet - never needed thickened liquids Diet Prior to this Study: NPO Temperature Spikes Noted: No Respiratory Status: Room air History of Recent Intubation: No Behavior/Cognition: Lethargic/Drowsy;Doesn't follow directions Oral Cavity Assessment: Other (comment) (difficult to visualize - mild bleeding with oral care) Oral Care Completed by SLP: Yes Oral Cavity - Dentition: Adequate natural dentition Vision:  (keeps eyes closed) Self-Feeding Abilities: Total assist Patient Positioning: Upright in bed Baseline Vocal Quality: Not observed Volitional Cough: Cognitively unable to elicit Volitional Swallow: Unable to elicit    Oral/Motor/Sensory Function Overall Oral Motor/Sensory Function:  (doesn't follow commands to assess)   Ice Chips Ice chips: Impaired Presentation: Spoon Oral Phase Impairments: Poor awareness of bolus;Other (comment) (no attempts at oral acceptance) Pharyngeal Phase Impairments: Unable to trigger swallow   Thin Liquid Thin Liquid: Impaired Presentation:  (  swab) Oral Phase Impairments: Poor awareness of bolus;Other (comment) (no attempts at acceptance) Oral Phase Functional Implications: Right anterior spillage (pf pooled  saliva) Pharyngeal  Phase Impairments: Unable to trigger swallow    Nectar Thick Nectar Thick Liquid: Not tested   Honey Thick Honey Thick Liquid: Not tested   Puree Puree: Not tested   Solid   GO   Solid: Not tested        Germain Osgood 04/08/2017,10:27 AM   Germain Osgood, M.A. CCC-SLP [phone]

## 2017-04-08 NOTE — Evaluation (Signed)
Speech Language Pathology Evaluation Patient Details Name: Kayla Hays MRN: [id number] DOB: [birth year] Today's Date: 04/08/2017 Time: 0354-6568 SLP Time Calculation (min) (ACUTE ONLY): 11 min  Problem List:  Patient Active Problem List   Diagnosis Date Noted  . ICH (intracerebral hemorrhage) ([place]) 04/07/2017  . PVC's (premature ventricular contractions) 05/18/2013  . Essential hypertension 05/18/2013  . Memory loss 05/18/2013   Past Medical History:  Past Medical History:  Diagnosis Date  . Anxiety   . Asthma   . Hypertension   . PVC's (premature ventricular contractions)    Past Surgical History:  Past Surgical History:  Procedure Laterality Date  . ABDOMINAL HYSTERECTOMY    . NO PAST SURGERIES     HPI:  Pt is a 76 year old female who presented with R sided weakness and aphasia. 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 Per caregiver/daughter, pt had only mild cognitive impairment prior to [place] in March of this year. After that time she had no difficulties with swallowing and communicaiton, but she needed full supervision at home. 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 Her linguistic abilities are also likely exacerbated by her decreased level of alertness and reduced focused attention as well. Per family/caregiver pt needed supervision at home since prior ICH earlier this year, but that she was communicating without difficulty. Pt will benefit from SLP f/u for cognitive-linguistic therapy to maximize functional communication and participation in daily activities.     SLP Assessment  SLP Recommendation/Assessment: Patient needs continued Speech Lanaguage Pathology Services SLP Visit Diagnosis: Aphasia (R47.01);Cognitive communication deficit (R41.841)    Follow Up  Recommendations   (tba)    Frequency and Duration min 3x week  2 weeks      SLP Evaluation Cognition  Overall Cognitive Status: Impaired/Different from baseline Arousal/Alertness: Lethargic Orientation Level: Other (comment) (non verbal) Attention: Focused Focused Attention: Impaired Focused Attention Impairment: Verbal basic;Functional basic       Comprehension  Auditory Comprehension Overall Auditory Comprehension: Impaired Commands: Impaired One Step Basic Commands: 0-24% accurate    Expression Expression Primary Mode of Expression: Verbal Verbal Expression Overall Verbal Expression: Impaired Initiation: Impaired Automatic Speech:  (none) Level of Generative/Spontaneous Verbalization:  (none) Non-Verbal Means of Communication: Other (comment) (no attempts)   Oral / Motor  Oral Motor/Sensory Function Overall Oral Motor/Sensory Function: Other (comment) (difficulty testing) Motor Speech Overall Motor Speech: Other (comment) (UTA)   GO                    Germain Osgood 04/08/2017, 10:33 AM  Germain Osgood, M.A. CCC-SLP [phone]

## 2017-04-08 NOTE — Progress Notes (Signed)
MRI called for pre-screening questions, this RN gave MRI tech daughter phone number to review medical history since pt is unable to answer.

## 2017-04-09 ENCOUNTER — Inpatient Hospital Stay (HOSPITAL_COMMUNITY): Payer: Medicare Other

## 2017-04-09 DIAGNOSIS — I639 Cerebral infarction, unspecified: Secondary | ICD-10-CM

## 2017-04-09 LAB — BASIC METABOLIC PANEL
ANION GAP: 6 (ref 5–15)
BUN: 7 mg/dL (ref 6–20)
CHLORIDE: 102 mmol/L (ref 101–111)
CO2: 27 mmol/L (ref 22–32)
Calcium: 8.7 mg/dL — ABNORMAL LOW (ref 8.9–10.3)
Creatinine, Ser: 0.47 mg/dL (ref 0.44–1.00)
GFR calc non Af Amer: >60 mL/min (ref >60)
Glucose, Bld: 119 mg/dL — ABNORMAL HIGH (ref 65–99)
POTASSIUM: 3.1 mmol/L — AB (ref 3.5–5.1)
Sodium: 135 mmol/L (ref 135–145)

## 2017-04-09 LAB — CBC
HCT: 39.2 % (ref 36.0–46.0)
HEMOGLOBIN: 13 g/dL (ref 12.0–15.0)
MCH: 29.5 pg (ref 26.0–34.0)
MCHC: 33.2 g/dL (ref 30.0–36.0)
MCV: 89.1 fL (ref 78.0–100.0)
Platelets: 212 10*3/uL (ref 150–400)
RBC: 4.4 MIL/uL (ref 3.87–5.11)
RDW: 13.1 % (ref 11.5–15.5)
WBC: 9.4 10*3/uL (ref 4.0–10.5)

## 2017-04-09 LAB — GLUCOSE, CAPILLARY
GLUCOSE-CAPILLARY: 111 mg/dL — AB (ref 65–99)
GLUCOSE-CAPILLARY: 111 mg/dL — AB (ref 65–99)
Glucose-Capillary: 117 mg/dL — ABNORMAL HIGH (ref 65–99)

## 2017-04-09 LAB — PHOSPHORUS: PHOSPHORUS: 3.1 mg/dL (ref 2.5–4.6)

## 2017-04-09 LAB — ECHOCARDIOGRAM COMPLETE
HEIGHTINCHES: 67 in
Weight: 1950.63 oz

## 2017-04-09 LAB — MAGNESIUM: MAGNESIUM: 1.8 mg/dL (ref 1.7–2.4)

## 2017-04-09 MED ORDER — CHLORHEXIDINE GLUCONATE 0.12 % MT SOLN
15.0000 mL | Freq: Two times a day (BID) | OROMUCOSAL | Status: DC
  Administered 2017-04-09 – 2017-04-11 (×4): 15 mL via OROMUCOSAL
  Filled 2017-04-09 (×4): qty 15

## 2017-04-09 MED ORDER — ORAL CARE MOUTH RINSE
15.0000 mL | Freq: Two times a day (BID) | OROMUCOSAL | Status: DC
  Administered 2017-04-09 – 2017-04-11 (×5): 15 mL via OROMUCOSAL

## 2017-04-09 NOTE — Progress Notes (Signed)
STROKE TEAM PROGRESS NOTE   SUBJECTIVE (INTERVAL HISTORY) Her daughter and in-home caregiver are at the bedside.  The patient is still obtunded, no significant change from yesterday. Did not pass swallow, no feeding tube placed as per patient living will. I had long discussion with patient and daughter at bedside, updated pt current condition, treatment plan and poor prognosis. Daughter requested family meeting in the afternoon.    OBJECTIVE Temp:  [98 F (36.7 C)-99.9 F (37.7 C)] 99.7 F (37.6 C) (06/22 1145) Pulse Rate:  [78-115] 87 (06/22 1315) Resp:  [14-23] 15 (06/22 1315) BP: (106-147)/(57-94) 137/72 (06/22 1315) SpO2:  [93 %-100 %] 98 % (06/22 1315)  CBC:  Recent Labs Lab 04/07/17 1530  04/08/17 0806 04/09/17 0554  WBC 6.1  --  11.8* 9.4  NEUTROABS 3.9  --   --   --   HGB 13.5  < > 13.5 13.0  HCT 39.9  < > 40.1 39.2  MCV 88.1  --  87.9 89.1  PLT 188  --  214 212  < > = values in this interval not displayed.  Basic Metabolic Panel:   Recent Labs Lab 04/08/17 0806 04/08/17 1609 04/09/17 0554  NA 134*  --  135  K 3.6  --  3.1*  CL 100*  --  102  CO2 26  --  27  GLUCOSE 143*  --  119*  BUN 7  --  7  CREATININE 0.50  --  0.47  CALCIUM 9.1  --  8.7*  MG  --  1.9 1.8  PHOS  --  3.0 3.1    Lipid Panel: No results found for: CHOL, TRIG, HDL, CHOLHDL, VLDL, LDLCALC HgbA1c: No results found for: HGBA1C Urine Drug Screen: No results found for: LABOPIA, COCAINSCRNUR, LABBENZ, AMPHETMU, THCU, LABBARB  Alcohol Level     Component Value Date/Time   ETH <5 04/07/2017 1532    IMAGING I have personally reviewed the radiological images below and agree with the radiology interpretations.  Ct Head Code Stroke W/o Cm 04/07/2017 IMPRESSION: 1. Recurrent acute parenchymal hemorrhage in the left hemisphere. Epicenter today is the left frontal operculum. Intra-axial blood volume estimated at 76 mL. 2. Moderate intraventricular extension of hemorrhage, but no acute  ventriculomegaly. 3. Regional mass effect in the left hemisphere but no midline shift or loss of basilar cisterns at this time. 4. ASPECTS is not applicable, acute hemorrhage.  MR Brain wo Contrast 04/08/2017 Recurrent left frontal lobe hematoma is stable. Intraventricular hemorrhage stable. 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 Smaller areas of hemorrhage in the right frontal lobe and left parietal lobe. Subarachnoid hemorrhage. 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  TTE pending  EEG  1) right frontotemporal slow activity 2) generalized irregular slow activity 3) slow PDR  Clinical Interpretation: This EEG is consistent with a generalized non-specific cerebral dysfunction(encephalopathy) with evidence also of a superimposed right frontotemporal cerebral dysfunction. There was no seizure or seizure predisposition recorded on this study.   PHYSICAL EXAM  Temp:  [98 F (36.7 C)-99.9 F (37.7 C)] 99.7 F (37.6 C) (06/22 1145) Pulse Rate:  [78-115] 87 (06/22 1315) Resp:  [14-23] 15 (06/22 1315) BP: (106-147)/(57-94) 137/72 (06/22 1315) SpO2:  [93 %-100 %] 98 % (06/22 1315)  General - Well nourished, well developed, obtunded.  Ophthalmologic - Fundi not visualized due to noncooperation.  Cardiovascular - Regular rate and rhythm.  Neuro - obtunded, not open eyes on voice or pain, not following commands, nonverbal. PERRL, positive corneal and gag,  positive doll's eyes, not blinking to visual threat bilaterally, breathing normally, right facial droop. On pain stimulation, left UE purposeful and localizing to pain, RUE trace withdraw on pain, LLE spontaneous movement and against gravity, RLE withdraw to pain but not able to against gravity. LUE increased tone with gegenhalten. DTR 3+ BUEs. No babinski. Sensation, coordination and gait not tested.   ASSESSMENT/PLAN Ms. AARVI STOTTS is a 76 y.o. female with history of Alzheimer's dementia,  hypertension, PVCs, and a recent [place] who presents with recurrent ICH. She did not receive IV t-PA due to [place].   Stroke:  acute recurrent left frontal ICH with moderate extension into the left ventricle and mass effect. 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 Of note, pt does have hx of HTN but felt less likely in this scenario.   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 Due to concerning for CAA, recommend to avoid antithrombotics as outpt  Ongoing aggressive stroke risk factor management  Therapy recommendations:  pending  Disposition:  Pending, daughter requested family meeting in the afternoon  Left UE coarse tremor, improved  EEG no seizure  As per daughter, pt had it with her first [place] in 01/2017  Today tremor much improved, near resolution  Hx of ICH and [facility]  01/2017 left frontal ICH - treated in [place] with NSG and steroids - sent to rehab for 3 weeks and then back home with 24h caregiver  03/19/17 repeat CT showed left frontal small multifocal SAH   With current recurrent left frontal cortical ICH, the process suggesting CAA  AD  Diagnosed 5 years ago  On aricept  Baseline before first ICH 80% lucid and 20% confusion. 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  Hypertension  stable on cleviprex Clevidipine gtt for SBP < 140 to prevent hematoma expansion Long-term  BP goal normotensive We'll have family meeting to decide on po access  Dysphagia  Failed swallow  Family refused an NG tube as per patient living will  Speech on board  Other Stroke Risk Factors  Advanced age   ETOH use, advised to drink no more than 1 drink(s) a day  Other Active Problems  Asthma  DNR/DNI status  Hospital day # 2  This patient is critically ill due to recurrent frontal ICH, hx of ICH, hx of SAH, AD, dysphagia and at significant risk of neurological worsening, death form recurrent ICH, seizure, hydrocephalus, aspiration, sepsis, heart failure. This patient's care requires constant monitoring of vital signs, hemodynamics, respiratory and cardiac monitoring, review of multiple databases, neurological assessment, discussion with family, other specialists and medical decision making of high complexity. I spent 45 minutes of neurocritical care time in the care of this patient. I again had long discussion with daughter at bedside, updated pt current condition, treatment plan and potential prognosis. She requested family meeting this afternoon.   Rosalin Hawking, MD PhD Stroke Neurology 04/09/2017  2:10 PM   To contact Stroke Continuity provider, please refer to [email or web address] After hours, contact General Neurology

## 2017-04-09 NOTE — Progress Notes (Signed)
PT Cancellation Note  Patient Details Name: Kayla Hays MRN: [id number] DOB: 1941-02-21   Cancelled Treatment:    Reason Eval/Treat Not Completed: Patient not medically ready. Pt remains on bedrest. Will continue to follow and initiate PT eval when pt medically appropriate.    Thelma Comp 04/09/2017, 9:01 AM   Rolinda Roan, PT, DPT Acute Rehabilitation Services Pager: [phone]

## 2017-04-09 NOTE — Progress Notes (Signed)
  Echocardiogram 2D Echocardiogram has been performed.  Cornella Emmer T Palmira Stickle 04/09/2017, 10:46 AM

## 2017-04-09 NOTE — Progress Notes (Signed)
Nutrition Brief Note  Cortrak consult received. Spoke with RN. Plan for comfort care, will discontinue tube request.   No nutrition interventions warranted at this time.  Please re-consult as needed.   [place], [place], [place] Pager [phone] After Hours Pager

## 2017-04-09 NOTE — Progress Notes (Signed)
  Speech Language Pathology Treatment: Dysphagia  Patient Details Name: Kayla Hays MRN: [id number] DOB: 1940/11/06 Today's Date: 04/09/2017 Time: 1740-8144 SLP Time Calculation (min) (ACUTE ONLY): 11 min  Assessment / Plan / Recommendation Clinical Impression  Pt remains unresponsive to PO trials, despite multimodal stimulation (thermal, tactile, gustatory, auditory).  She keeps her teeth clenched and does not open her mouth for acceptance. Recommend that she remain NPO pending any improved alertness.   HPI HPI: Pt is a 76 year old female who presented with R sided weakness and aphasia. 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 Per caregiver/daughter, pt had only mild cognitive impairment prior to [place] in March of this year. After that time she had no difficulties with swallowing and communicaiton, but she needed full supervision at home. PMH includes dementia, HTN, asthma, anxiety      SLP Plan  Continue with current plan of care       Recommendations  Diet recommendations: NPO Medication Administration: Via alternative means                Oral Care Recommendations: Oral care QID Follow up Recommendations:  (tba) SLP Visit Diagnosis: Dysphagia, unspecified (R13.10) Plan: Continue with current plan of care       GO                Germain Osgood 04/09/2017, 2:17 PM   Germain Osgood, M.A. CCC-SLP [phone]

## 2017-04-09 NOTE — Care Management Note (Signed)
Case Management Note  Patient Details  Name: Kayla Hays MRN: [id number] Date of Birth: August 10, 1941  Subjective/Objective:   Pt admitted on 04/07/17 with recurrent ICH.  PTA, pt resided at home with 24h caregivers.  Pt has end-stage dementia per H&P.      Action/Plan: PT/OT evaluations pending.  Will follow for discharge planning as pt progresses.    Expected Discharge Date:                  Expected Discharge Plan:  [place]  In-House Referral:     Discharge planning Services  CM Consult  Post Acute Care Choice:    Choice offered to:     DME Arranged:    DME Agency:     HH Arranged:    [place] Agency:     Status of Service:  In process, will continue to follow  If discussed at Long Length of Stay Meetings, dates discussed:    Additional Comments:  04/09/2017 Elenor Quinones, RN, BSN [phone] CM read attending note regarding possible hospice.  Attending has consulted with Palliative to help establish goals of care - at this time it hasn't been yet determined if pt is residential hospice or home with hospice appropriate.  Attending will revisit with family and initate goals of care - possibly transitioning to comfort care.  Attending will consult CM if home with hospice is determined appropriate.  Pt remains on    04/09/17 J. Amerson, RN, BSN Received call from [facility]:  Pt active with [facility] prior to admission for [facility] speech therapy currently.  Please notify [facility] agency with discharge date; they will need resumption orders for [facility] services to continue.    Reinaldo Raddle, RN, BSN  Trauma/Neuro ICU Case Manager [phone]

## 2017-04-09 NOTE — Plan of Care (Signed)
Had a lengthy family meeting with two daughters and the son as well as granddaughter with a close family friend at bedside. Reviewed on neuro images, updated pt current condition, treatment plan and potential prognosis. They also brought pt living wills and indicated DNR, DNI and no feeding tubes. Family requested hospice care. Will discussed with CM and SW to initiate the process.   Rosalin Hawking, MD PhD Stroke Neurology 04/09/2017 3:27 PM

## 2017-04-09 NOTE — Care Management Note (Signed)
Case Management Note  Patient Details  Name: Kayla Hays MRN: [id number] Date of Birth: 10/21/1940  Subjective/Objective:   Pt admitted on 04/07/17 with recurrent ICH.  PTA, pt resided at home with 24h caregivers.  Pt has end-stage dementia per H&P.      Action/Plan: PT/OT evaluations pending.  Will follow for discharge planning as pt progresses.    Expected Discharge Date:                  Expected Discharge Plan:  [facility]  In-House Referral:     Discharge planning Services  CM Consult  Post Acute Care Choice:    Choice offered to:     DME Arranged:    DME Agency:     HH Arranged:    [place] Agency:     Status of Service:  In process, will continue to follow  If discussed at Long Length of Stay Meetings, dates discussed:    Additional Comments:  04/09/17 J. Adja Ruff, RN, BSN Received call from [facility]:  Pt active with [facility] prior to admission for [facility] speech therapy currently.  Please notify [facility] agency with discharge date; they will need resumption orders for [facility] services to continue.    Reinaldo Raddle, RN, BSN  Trauma/Neuro ICU Case Manager [phone]

## 2017-04-09 NOTE — Progress Notes (Signed)
OT Cancellation Note  Patient Details Name: Kayla Hays MRN: [id number] DOB: Dec 12, 1940   Cancelled Treatment:    Reason Eval/Treat Not Completed: Patient not medically ready. Pt remains on bedrest. Will await increase in activity orders prior to initiating OT evaluation. Thank you for this referral!   Norman Herrlich, MS OTR/L  Pager: [name] 04/09/2017, 7:38 AM

## 2017-04-10 ENCOUNTER — Encounter (HOSPITAL_COMMUNITY): Payer: Self-pay

## 2017-04-10 DIAGNOSIS — Z515 Encounter for palliative care: Secondary | ICD-10-CM

## 2017-04-10 LAB — CBC
HCT: 40.9 % (ref 36.0–46.0)
Hemoglobin: 13.7 g/dL (ref 12.0–15.0)
MCH: 29.8 pg (ref 26.0–34.0)
MCHC: 33.5 g/dL (ref 30.0–36.0)
MCV: 88.9 fL (ref 78.0–100.0)
PLATELETS: 206 10*3/uL (ref 150–400)
RBC: 4.6 MIL/uL (ref 3.87–5.11)
RDW: 13.1 % (ref 11.5–15.5)
WBC: 12.4 10*3/uL — ABNORMAL HIGH (ref 4.0–10.5)

## 2017-04-10 LAB — BASIC METABOLIC PANEL
Anion gap: 10 (ref 5–15)
BUN: 9 mg/dL (ref 6–20)
CHLORIDE: 102 mmol/L (ref 101–111)
CO2: 23 mmol/L (ref 22–32)
CREATININE: 0.55 mg/dL (ref 0.44–1.00)
Calcium: 8.7 mg/dL — ABNORMAL LOW (ref 8.9–10.3)
GFR calc Af Amer: >60 mL/min (ref >60)
Glucose, Bld: 111 mg/dL — ABNORMAL HIGH (ref 65–99)
Potassium: 2.9 mmol/L — ABNORMAL LOW (ref 3.5–5.1)
SODIUM: 135 mmol/L (ref 135–145)

## 2017-04-10 LAB — GLUCOSE, CAPILLARY
GLUCOSE-CAPILLARY: 110 mg/dL — AB (ref 65–99)
Glucose-Capillary: 124 mg/dL — ABNORMAL HIGH (ref 65–99)
Glucose-Capillary: 113 mg/dL — ABNORMAL HIGH (ref 65–99)

## 2017-04-10 MED ORDER — GLYCOPYRROLATE 0.2 MG/ML IJ SOLN: 0.2000 mg | INTRAMUSCULAR | Status: DC | PRN

## 2017-04-10 MED ORDER — MORPHINE SULFATE (PF) 4 MG/ML IV SOLN
2.0000 mg | INTRAVENOUS | Status: DC | PRN
  Administered 2017-04-10 – 2017-04-12 (×8): 2 mg via INTRAVENOUS
  Filled 2017-04-10 (×8): qty 1

## 2017-04-10 MED ORDER — SCOPOLAMINE 1 MG/3DAYS TD PT72
1.0000 | MEDICATED_PATCH | TRANSDERMAL | Status: DC
  Administered 2017-04-10: 1.5 mg via TRANSDERMAL
  Filled 2017-04-10: qty 1

## 2017-04-10 MED ORDER — LORAZEPAM 2 MG/ML IJ SOLN: 0.5000 mg | INTRAMUSCULAR | Status: DC | PRN

## 2017-04-10 NOTE — Progress Notes (Signed)
PT Cancellation Note  Patient Details Name: Kayla Hays MRN: [id number] DOB: September 22, 1941   Cancelled Treatment:    Reason Eval/Treat Not Completed: Other (comment).  Full comfort care now.  PT to sign off.  Thanks,    Barbarann Ehlers. [place], [place], DPT [phone]   04/10/2017, 2:29 PM

## 2017-04-10 NOTE — Progress Notes (Signed)
STROKE TEAM PROGRESS NOTE   SUBJECTIVE (INTERVAL HISTORY) Her granddaughter is at the bedside.  Palliative care team involved and plan to transition to inpatient hospice. Patient currently comfort care measures without discomfort.   OBJECTIVE Temp:  [98.4 F (36.9 C)-100.6 F (38.1 C)] 100.6 F (38.1 C) (06/23 0400) Pulse Rate:  [78-112] 99 (06/23 0700) Resp:  [14-30] 17 (06/23 0700) BP: (106-147)/(57-114) 137/63 (06/23 0700) SpO2:  [94 %-98 %] 96 % (06/23 0700)  CBC:  Recent Labs Lab 04/07/17 1530  04/09/17 0554 04/10/17 0505  WBC 6.1  < > 9.4 12.4*  NEUTROABS 3.9  --   --   --   HGB 13.5  < > 13.0 13.7  HCT 39.9  < > 39.2 40.9  MCV 88.1  < > 89.1 88.9  PLT 188  < > 212 206  < > = values in this interval not displayed.  Basic Metabolic Panel:   Recent Labs Lab 04/08/17 1609 04/09/17 0554 04/10/17 0505  NA  --  135 135  K  --  3.1* 2.9*  CL  --  102 102  CO2  --  27 23  GLUCOSE  --  119* 111*  BUN  --  7 9  CREATININE  --  0.47 0.55  CALCIUM  --  8.7* 8.7*  MG 1.9 1.8  --   PHOS 3.0 3.1  --     Lipid Panel: No results found for: CHOL, TRIG, HDL, CHOLHDL, VLDL, LDLCALC HgbA1c: No results found for: HGBA1C Urine Drug Screen: No results found for: LABOPIA, COCAINSCRNUR, LABBENZ, AMPHETMU, THCU, LABBARB  Alcohol Level     Component Value Date/Time   ETH <5 04/07/2017 1532    IMAGING I have personally reviewed the radiological images below and agree with the radiology interpretations.  Ct Head Code Stroke W/o Cm 04/07/2017 1. Recurrent acute parenchymal hemorrhage in the left hemisphere. Epicenter today is the left frontal operculum. Intra-axial blood volume estimated at 76 mL.  2. Moderate intraventricular extension of hemorrhage, but no acute ventriculomegaly.  3. Regional mass effect in the left hemisphere but no midline shift or loss of basilar cisterns at this time.  4. ASPECTS is not applicable, acute hemorrhage.  MR Brain wo  Contrast 04/08/2017 Recurrent left frontal lobe hematoma is stable. Intraventricular hemorrhage stable. 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 Smaller areas of hemorrhage in the right frontal lobe and left parietal lobe. Subarachnoid hemorrhage. 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  TTE  04/09/2017 Study [id number]  - Left ventricle: The cavity size was normal. Wall thickness was   increased in a pattern of mild LVH. Systolic function was normal.   The estimated ejection fraction was in the range of 60% to 65%. - Aortic valve: Transvalvular velocity was within the normal range.   There was no stenosis. There was no regurgitation. - Mitral valve: Transvalvular velocity was within the normal range.   There was no evidence for stenosis. There was no regurgitation. - Right ventricle: The cavity size was normal. Wall thickness was   normal. Systolic function was normal. - Atrial septum: No defect or patent foramen ovale was identified. - Tricuspid valve: There was mild regurgitation. - Pulmonary arteries: PA peak pressure: 31 mm Hg (S).  EEG  1) right frontotemporal slow activity 2) generalized irregular slow activity 3) slow PDR Clinical Interpretation: This EEG is consistent with a generalized non-specific cerebral dysfunction(encephalopathy) with evidence also of a superimposed right frontotemporal cerebral dysfunction. There was no seizure or seizure  predisposition recorded on this study.    PHYSICAL EXAM  Temp:  [98.4 F (36.9 C)-100.6 F (38.1 C)] 100.6 F (38.1 C) (06/23 0400) Pulse Rate:  [78-112] 99 (06/23 0700) Resp:  [14-30] 17 (06/23 0700) BP: (106-147)/(57-114) 137/63 (06/23 0700) SpO2:  [94 %-98 %] 96 % (06/23 0700)  General - Well nourished, well developed, in coma.  Ophthalmologic - Fundi not visualized due to noncooperation.  Cardiovascular - Regular rate and rhythm.  Neuro - examination limited due to  comfort care measures. comatose, nonresponsive, not open eyes on voice, not following commands. Snoreous breathing, right facial droop. No spontaneous movement of all extremities.   ASSESSMENT/PLAN Kayla Hays is a 76 y.o. female with history of Alzheimer's dementia, hypertension, PVCs, and a recent [place] who presents with recurrent ICH. She did not receive IV t-PA due to [place].   Stroke:  acute recurrent left frontal ICH with moderate extension into the left ventricle and mass effect. 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 Of note, pt does have hx of HTN but felt less likely in this scenario.   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 No cardiac source of emboli identified.  Continued home Keppra 500 BID for seizure prophylaxis  SCDs for VTE prophylaxis Diet NPO time specified  aspirin 81 mg daily prior to admission, now on No antithrombotic.   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  Recommend avoiding antithrombotics   Disposition:  Inpatient hospice  Hx of ICH and [facility]  01/2017 left frontal ICH - treated in [place] with NSG and steroids - sent to rehab for 3 weeks and then back home with 24h caregiver  03/19/17 repeat CT showed left frontal small multifocal SAH   With current recurrent left frontal cortical ICH, the process suggesting CAA  AD  Diagnosed 5 years ago  On aricept  Baseline before first ICH 80% lucid and 20% confusion. 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 The patient is an appropriate candidate for inpatient hospice. A social work consult has been ordered.  Hospital day # 3  Rosalin Hawking, MD PhD Stroke Neurology 04/10/2017 4:33 PM  To contact Stroke Continuity provider, please refer to [email or web address] After hours, contact General Neurology

## 2017-04-10 NOTE — Consult Note (Signed)
Consultation Note Date: 04/10/2017   Patient Name: Kayla Hays  DOB: [birth year]  MRN: [id number]  Age / Sex: 76 y.o., female  PCP: Malka So, MD (Inactive) Referring Physician: Rosalin Hawking, MD  Reason for Consultation: Establishing goals of care, Hospice Evaluation, Inpatient hospice referral, Non pain symptom management, Pain control, Psychosocial/spiritual support and Terminal Care  HPI/Patient Profile: 76 y.o. female  with past medical history of HTN, prior ICH 4/18, dementia admitted on 04/07/2017 with left sided weakness, and found to have had another left frontal ICH. She has been unresponsive since admission. Per chart review, pt's advance directive were for no artificial feeding and family interested in hospice and comfort care. .   Clinical Assessment and Goals of Care: Pt is unresponsive and cannot participate in assessment. Her caregiver is at the bedside. LM for her daughter as well as speaking to neurology and placing SW consult. Resp are even and unlabored. No non-verbal s/s of pain e.g. Grimacing . Pt had done advanced directives that were specific to not doing anything aggressive to prolong her life  Pt has 1 daughter and 1 son. She is unable to participate in [place] discussion  Per conversation with son, dtr and extended family pt has been verbalizing "ready to go" since her stroke in April. They are all in agreement for comfort care and wish to take her home with hospice support    SUMMARY OF RECOMMENDATIONS   DNR/DNI No NG or permanent feeding tube Home with hospice. Order placed to care mgt. Offered choice of agency and they selected [facility]. Contacted their hospital liaison. Goal is to have her home on Monday ( family needs a day to get equipment in the home and to organize additional caregivers) DC IVF Code Status/Advance Care Planning:  DNR    Symptom  Management:   Dyspnea/Pain: . Will add MS04 PRN IV in case pt needs support.   Secretions: Apply scop TD  Agitation: Ativan PRN  Palliative Prophylaxis:   Aspiration, Bowel Regimen, Delirium Protocol, Eye Care, Frequent Pain Assessment, Oral Care and Turn Reposition  Additional Recommendations (Limitations, Scope, Preferences):  Full Comfort Care  Psycho-social/Spiritual:   Desire for further Chaplaincy support:no  Additional Recommendations: Grief/Bereavement Support  Prognosis:   < 2 weeks in the setting of recurrent ICH, she is unresponsive requiring injectable RX to maintain comfort. No longer eating or drinking  Discharge Planning: Home with hospice. Offered choice of agency and they have elected [facility]. Hospital liaison notified as well as CM order placed      Primary Diagnoses: Present on Admission: . ICH (intracerebral hemorrhage) ([place])   I have reviewed the medical record, interviewed the patient and family, and examined the patient. The following aspects are pertinent.  Past Medical History:  Diagnosis Date  . Anxiety   . Asthma   . Hypertension   . PVC's (premature ventricular contractions)    Social History   Social History  . Marital status: Widowed    Spouse name: N/A  .  Number of children: N/A  . Years of education: N/A   Social History Main Topics  . Smoking status: Former Smoker    Quit date: 05/18/1988  . Smokeless tobacco: Never Used  . Alcohol use Yes     Comment: occassional  . Drug use: No  . Sexual activity: Not Asked   Other Topics Concern  . None   Social History Narrative  . None   Family History  Problem Relation Age of Onset  . Breast cancer Mother   . Heart disease Maternal Grandfather   . Cancer Paternal Grandfather    Scheduled Meds: . chlorhexidine  15 mL Mouth Rinse BID  . mouth rinse  15 mL Mouth Rinse q12n4p  . pantoprazole (PROTONIX) IV  40 mg Intravenous QHS  . senna-docusate  1 tablet  Oral BID   Continuous Infusions: . sodium chloride 75 mL/hr at 04/10/17 0910  . clevidipine 4 mg/hr (04/10/17 1011)  . levETIRAcetam Stopped (04/10/17 0933)   PRN Meds:.acetaminophen **OR** acetaminophen (TYLENOL) oral liquid 160 mg/5 mL **OR** acetaminophen, glycopyrrolate, labetalol, LORazepam, morphine injection Medications Prior to Admission:  Prior to Admission medications   Medication Sig Start Date End Date Taking? Authorizing Provider  aspirin 81 MG tablet Take 81 mg by mouth daily.    Yes [provider]  donepezil (ARICEPT) 10 MG tablet Take 10 mg by mouth at bedtime.   Yes [provider]  escitalopram (LEXAPRO) 20 MG tablet Take 20 mg by mouth daily. 01/17/17  Yes [provider]  fexofenadine (ALLEGRA) 180 MG tablet Take 180 mg by mouth daily.   Yes [provider]  levETIRAcetam (KEPPRA) 500 MG tablet Take 500 mg by mouth 2 (two) times daily.   Yes [provider]  lisinopril (PRINIVIL,ZESTRIL) 5 MG tablet Take 1 tablet (5 mg total) by mouth daily. NEED OV. 02/11/16  Yes Weaver, Scott T, PA-C  metoprolol succinate (TOPROL-XL) 50 MG 24 hr tablet Take 1 tablet (50 mg total) by mouth daily. Take with or immediately following a meal. Patient taking differently: Take 100 mg by mouth daily. Take with or immediately following a meal. 08/13/15  Yes Lorretta Harp, MD  montelukast (SINGULAIR) 10 MG tablet Take 10 mg by mouth daily.   Yes [provider]  Multiple Vitamin (MULTIVITAMIN) capsule Take 1 capsule by mouth daily.   Yes [provider]  albuterol (PROVENTIL HFA;VENTOLIN HFA) 108 (90 Base) MCG/ACT inhaler Inhale 1-2 puffs into the lungs every 4 (four) hours as needed for wheezing or shortness of breath. 09/06/16   Blanchie Dessert, MD  Dietary Management Product (ENLYTE) CAPS Take 1 capsule by mouth daily.    [provider]  FLOVENT HFA 44 MCG/ACT inhaler Inhale 1 puff into the lungs as needed (FOR  SHORTNESS OF BREATH AND WHEEZING).  05/10/14   [provider]  loperamide (IMODIUM A-D) 2 MG tablet Take 1-2 mg by mouth as needed for diarrhea or loose stools.     [provider]   Allergies  Allergen Reactions  . Tetanus Toxoids Other (See Comments)    Passes out  . Theophyllines Other (See Comments)    Passes out  . Penicillins Other (See Comments)    Unknown  . Rivastigmine Tartrate Hives and Itching  . Sulfa Antibiotics Other (See Comments)    Unknown   Review of Systems  Unable to perform ROS: Patient nonverbal    Physical Exam  Constitutional: She appears well-developed and well-nourished.  Unresponsive acutely ill appearing female  HENT:  Head: Normocephalic and atraumatic.  Neurological:  unresponsive  Skin: Skin is warm and dry.  No mottling  Psychiatric:  Unable to test  Nursing note and vitals reviewed.   Vital Signs: BP 130/69   Pulse (!) 102   Temp (!) 101.1 F (38.4 C) (Axillary)   Resp 19   Ht 5\' 7"  (1.702 m)   Wt 55.3 kg (121 lb 14.6 oz)   SpO2 98%   BMI 19.09 kg/m  Pain Assessment: 0-10   Pain Score: Asleep   SpO2: SpO2: 98 % O2 Device:SpO2: 98 % O2 Flow Rate: .   IO: Intake/output summary:  Intake/Output Summary (Last 24 hours) at 04/10/17 1013 Last data filed at 04/10/17 5449  Gross per 24 hour  Intake              816 ml  Output             1050 ml  Net             -234 ml    LBM: Last BM Date:  (pta) Baseline Weight: Weight: 57.4 kg (126 lb 8.7 oz) Most recent weight: Weight: 55.3 kg (121 lb 14.6 oz)     Palliative Assessment/Data:   Flowsheet Rows     Most Recent Value  Intake Tab  Referral Department  Neurology  Unit at Time of Referral  ICU  Palliative Care Primary Diagnosis  Neurology  Date Notified  04/09/17  Palliative Care Type  New Palliative care  Reason for referral  Counsel Regarding Hospice, Clarify Goals of Care  Date of Admission  04/10/17  Date first seen by Palliative Care  04/10/17    # of days Palliative referral response time  1 Day(s)  # of days IP prior to Palliative referral  -1  Clinical Assessment  Palliative Performance Scale Score  20%  Pain Max last 24 hours  Not able to report  Pain Min Last 24 hours  Not able to report  Dyspnea Max Last 24 Hours  Not able to report  Dyspnea Min Last 24 hours  Not able to report  Nausea Max Last 24 Hours  Not able to report  Nausea Min Last 24 Hours  Not able to report  Anxiety Max Last 24 Hours  Not able to report  Anxiety Min Last 24 Hours  Not able to report  Other Max Last 24 Hours  Not able to report  Psychosocial & Spiritual Assessment  Palliative Care Outcomes  Patient/Family wishes: Interventions discontinued/not started   Mechanical Ventilation, BiPAP, Hemodialysis, Transfusion, Vasopressors, Trach, NIPPV, Tube feedings/TPN, PEG  Palliative Care follow-up planned  Yes, Facility      Time In: 1500 Time Out: 1615 Time Total: 75 min Greater than 50%  of this time was spent counseling and coordinating care related to the above assessment and plan.  Signed by: Dory Horn, NP   Please contact Palliative Medicine Team phone at [phone] for questions and concerns.  For individual provider: See Shea Evans

## 2017-04-10 NOTE — Progress Notes (Signed)
SLP Cancellation Note  Patient Details Name: Kayla Hays MRN: [id number] DOB: [birth year]   Cancelled treatment:       Reason Eval/Treat Not Completed: Patient not medically ready. Per RN no SLP needs at this time given plan of care. Will sign off. Reconsult if needed.    Vinessa Macconnell, Katherene Ponto 04/10/2017, 9:32 AM

## 2017-04-11 NOTE — Consult Note (Signed)
[facility] Met with Daughter, Benita Stabile, Thurmond Butts, and multiple friends in the patient's home @ 1:00pm today, per their request. Reviewed hospice philosophy and scope of services that are consistent with the patient's Living Will and the family's stated Goals of Care. The patient has been approved for hospice services in her home, and the family has arranged caregivers around the clock. DME needs discussed and ordered for delivery later today: fully electric bed with T5 mattress and full rails, OBT, O2, and oral suction with yanker wand. Visited the patient this evening @ 7:00pm to confirm clinical status, and spoke with family again. Please provide Rx's for symptom management at discharge: Oral Morphine concentrate and dispersible oral lorazepam. Please leave Foley catheter in place. IV can be removed. Will need an out of facility DNR for ambulance transport ([facility]). The family is hoping for an early discharge tomorrow. DME has been confirmed in place. Thank you for this referral.  Yetta Glassman RN [place] Cell: [phone]

## 2017-04-11 NOTE — Care Management Note (Signed)
Case Management Note  Patient Details  Name: Kayla Hays MRN: [id number] Date of Birth: 08/14/1941  Subjective/Objective:                  left-sided weakness Action/Plan: Discharge planning Expected Discharge Date:                  Expected Discharge Plan:  Home w Hospice Care  In-House Referral:     Discharge planning Services  CM Consult  Post Acute Care Choice:    Choice offered to:  Patient, Adult Children  DME Arranged:  N/A DME Agency:  NA  HH Arranged:  NA HH Agency:  [facility]  Status of Service:  In process, will continue to follow  If discussed at Long Length of Stay Meetings, dates discussed:    Additional Comments: CM met with pt and pt's daughter Jill Tweedy [phone] to confirm choice.  Dianna Stevens of [facility] has already contacted the family.  DME to be arranged with plan of discharge after DME is in place (04/12/17). CM will follow for probable PTAR transport home. ,  Christine, RN 04/11/2017, 1:38 PM  

## 2017-04-11 NOTE — Discharge Summary (Signed)
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)   Hx of cerebral parenchymal hemorrhage   Dysphagia following cerebrovascular accident (CVA)   Asthma  Past Medical History:  Diagnosis Date  . Anxiety   . Asthma   . Hypertension   . PVC's (premature ventricular contractions)    Past Surgical History:  Procedure Laterality Date  . ABDOMINAL HYSTERECTOMY    . NO PAST SURGERIES      Allergies as of 04/12/2017      Reactions   Tetanus Toxoids Other (See Comments)   Passes out   Theophyllines Other (See Comments)   Passes out   Penicillins Other (See Comments)   Unknown   Rivastigmine Tartrate Hives, Itching   Sulfa Antibiotics Other (See Comments)   Unknown      Medication List    STOP taking these medications   albuterol 108 (90 Base) MCG/ACT inhaler Commonly known as:  PROVENTIL HFA;VENTOLIN HFA   aspirin 81 MG tablet   donepezil 10 MG tablet Commonly known as:  ARICEPT   ENLYTE Caps   escitalopram 20 MG tablet Commonly known as:  LEXAPRO   fexofenadine 180 MG tablet Commonly known as:  ALLEGRA   FLOVENT HFA 44 MCG/ACT inhaler Generic drug:  fluticasone   levETIRAcetam 500 MG tablet Commonly known as:  KEPPRA   lisinopril 5 MG tablet Commonly known as:  PRINIVIL,ZESTRIL   loperamide 2 MG tablet Commonly known as:  IMODIUM A-D   metoprolol succinate 50 MG 24 hr tablet Commonly  known as:  TOPROL-XL   montelukast 10 MG tablet Commonly known as:  SINGULAIR   multivitamin capsule     TAKE these medications   acetaminophen 650 MG suppository Commonly known as:  TYLENOL Place 1 suppository (650 mg total) rectally every 4 (four) hours as needed for mild pain.   LORazepam 0.5 MG tablet Commonly known as:  ATIVAN Place 1 tablet (0.5 mg total) under the tongue every 6 (six) hours as needed for anxiety.   morphine CONCENTRATE 10 MG/0.5ML Soln concentrated solution Place 0.5 mLs (10 mg total) under the tongue every 2 (two) hours as needed for moderate pain, severe pain, anxiety or shortness of breath.   scopolamine 1 MG/3DAYS Commonly known as:  TRANSDERM-SCOP Place 1 patch (1.5 mg total) onto the skin every 3 (three) days. Start taking on:  04/13/2017       LABORATORY STUDIES CBC    Component Value Date/Time   WBC 12.4 (H) 04/10/2017 0505   RBC 4.60 04/10/2017 0505   HGB 13.7 04/10/2017 0505   HGB 13.6 10/15/2011 0954   HGB 13.3 09/09/2007 0920   HCT 40.9 04/10/2017 0505   HCT 40.3 10/15/2011 0954   HCT 37.9 09/09/2007 0920   PLT 206 04/10/2017 0505   PLT 209 10/15/2011 0954   PLT 215 09/09/2007 0920   MCV 88.9 04/10/2017 0505   MCV 90 10/15/2011 0954   MCV 87.9 09/09/2007 0920  MCH 29.8 04/10/2017 0505   MCHC 33.5 04/10/2017 0505   RDW 13.1 04/10/2017 0505   RDW 12.6 10/15/2011 0954   RDW 13.3 09/09/2007 0920   LYMPHSABS 1.2 04/07/2017 1530   LYMPHSABS 0.9 10/15/2011 0954   LYMPHSABS 1.2 09/09/2007 0920   MONOABS 0.7 04/07/2017 1530   MONOABS 0.4 09/09/2007 0920   EOSABS 0.2 04/07/2017 1530   EOSABS 0.2 10/15/2011 0954   BASOSABS 0.1 04/07/2017 1530   BASOSABS 0.1 10/15/2011 0954   BASOSABS 0.0 09/09/2007 0920   CMP    Component Value Date/Time   NA 135 04/10/2017 0505   K 2.9 (L) 04/10/2017 0505   CL 102 04/10/2017 0505   CO2 23 04/10/2017 0505   GLUCOSE 111 (H) 04/10/2017 0505   BUN 9 04/10/2017 0505   CREATININE 0.55  04/10/2017 0505   CREATININE 0.71 02/11/2016 1632   CALCIUM 8.7 (L) 04/10/2017 0505   PROT 6.4 (L) 04/07/2017 1530   ALBUMIN 3.7 04/07/2017 1530   AST 27 04/07/2017 1530   ALT 23 04/07/2017 1530   ALKPHOS 62 04/07/2017 1530   BILITOT 0.9 04/07/2017 1530   GFRNONAA >60 04/10/2017 0505   GFRAA >60 04/10/2017 0505   COAGS Lab Results  Component Value Date   INR 0.97 04/07/2017   Alcohol Level    Component Value Date/Time   ETH <5 04/07/2017 1532    SIGNIFICANT DIAGNOSTIC STUDIES CT Head Code Stroke W/o Cm 04/07/2017 1. Recurrent acute parenchymal hemorrhage in the left hemisphere. Epicenter today is the left frontal operculum. Intra-axial blood volume estimated at 76 mL.  2. Moderate intraventricular extension of hemorrhage, but no acute ventriculomegaly.  3. Regional mass effect in the left hemisphere but no midline shift or loss of basilar cisterns at this time.  4. ASPECTS is not applicable, acute hemorrhage.  MR Brain wo Contrast 04/08/2017 Recurrent left frontal lobe hematoma is stable. Intraventricular hemorrhage stable. 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 Smaller areas of hemorrhage in the right frontal lobe and left parietal lobe. Subarachnoid hemorrhage. 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  TTE  04/09/2017 - Left ventricle: The cavity size was normal. Wall thickness wasincreased in a pattern of mild LVH. Systolic function was normal.The estimated ejection fraction was in the range of 60% to 65%. - Aortic valve: Transvalvular velocity was within the normal range.There was no stenosis. There was no regurgitation. - Mitral valve: Transvalvular velocity was within the normal range.There was no evidence for stenosis. There was no regurgitation. - Right ventricle: The cavity size was normal. Wall thickness wasnormal. Systolic function was normal. - Atrial septum: No defect or patent foramen ovale was identified. -  Tricuspid valve: There was mild regurgitation. - Pulmonary arteries: PA peak pressure: 31 mm Hg (S).  EEG  1) right frontotemporal slow activity 2) generalized irregular slow activity 3) slow PDR Clinical Interpretation: This EEG is consistent with a generalized non-specific cerebral dysfunction(encephalopathy) with evidence also of a superimposed right frontotemporal cerebral dysfunction. There was no seizure or seizure predisposition recorded on this study.   HISTORY OF PRESENT ILLNESS Kayla Hays is a 76 y.o. female with a history of recent [place] who presents with recurrent ICH. She was last known well definitely sometime around 10 AM on the day of admission. When checked on around 1:30 PM she was seen to have left-sided weakness. It is possible that she was seen between these 2 timeframes, not by family but rather by caregivers who were not present at time of admission to obtain  further history. She presented with right-sided weakness and aphasia, was taken for a stat head CT per code stroke protocol. The CT scan revealed a large ICH. At baseline, she has end-stage dementia and was at home with 24-hour per day care.  HOSPITAL COURSE Ms. Kayla Hays is a 76 y.o. female with history of Alzheimer's dementia, hypertension, PVCs, and a recent [place] who presented with a recurrent ICH. She did not receive IV t-PA due to [place]. She was admitted to the neuro intensive care unit.  Stroke:  acute recurrent left frontal ICH with moderate extension into the left ventricle and mass effect. Together with recent left frontal cortical ICH in 01/2017 and left frontal [facility] 03/19/17 as well as Alzheimer's dementia history, Cerebral Amyloid Angiopathy is most likely the cause of recurrent cortical bleeding. Of note, the pt does have a hx of HTN but felt less likely in this scenario.   Resultant  obtunded  CT head: Recurrent left frontal IPH with IVH; suspect etiology of amyloid angiopathy associated with  Alzheimer's disease  MRI head: stable left frontal ICH with ventrilomegaly, concerning for CAA  EEG diffuse slowing with bitemporal slowing, no seizure  TTE - EF 60-65%. No cardiac source of emboli identified.  Continued home Keppra 500 BID for seizure prophylaxis  aspirin 81 mg daily prior to admission, now on No antithrombotic given hemorrhage  Concerning for CAA.  Recommend avoiding antithrombotics life long  On 04/09/2017 Dr Erlinda Hong had a lengthy family meeting at the bedside with two daughters, the son, a granddaughter and a close family friend. He reviewed with them the neuro images and gave an update on the patient's condition, treatment plan and the potential prognosis. The family brought the patient's living will which indicated DNR, DNI and no feeding tubes. The family requested hospice care. Dr Erlinda Hong discussed this with the case manager and social worker to help initiate the process. A palliative care consult was obtained. A decision was made to proceed with discharge on Monday, 04/12/2017, to home for hospice care.  Disposition:  home hospice Monday 04/12/2017.  Hx of ICH and [facility]  01/2017 left frontal ICH - treated in [place] with NSG and steroids - sent to rehab for 3 weeks and then back home with 24h caregiver  03/19/17 repeat CT showed left frontal small multifocal SAH   With current recurrent left frontal cortical ICH, the process suggesting CAA  Alzheimer's dementia  Diagnosed 5 years ago  On aricept  Baseline before first ICH 80% lucid and 20% confusion. 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  Hypertension  Stable   Off BP meds due to comfort care measures  Dysphagia  Failed swallow  No feeding tube due to comfort care measures  Other Stroke Risk Factors  Advanced age   ETOH use  Other Active Problems  Asthma   DISCHARGE EXAM Blood pressure (!) 114/54, pulse 99, temperature (!) 101 F  (38.3 C), temperature source Axillary, resp. rate (!) 25, height 5\' 7"  (1.702 m), weight 55.3 kg (121 lb 14.6 oz), SpO2 99 %. General - Well nourished, well developed, in coma. Ophthalmologic - Fundi not visualized due to noncooperation. Cardiovascular - Regular rate and rhythm. Neuro - examination limited due to comfort care measures. comatose, nonresponsive, not open eyes on voice, not following commands. Snoreous breathing, right facial droop. No spontaneous movement of all extremities.  Discharge Diet   NPO    DISCHARGE  PLAN  Disposition:  Home hospice care ([place])  No antithrombotic for secondary stroke prevention due to [place].  Comfort meds for home use  Out of facility DNR  Hospice providers to provide needed followup  35 minutes were spent preparing discharge.  I have personally examined this patient, reviewed notes, independently viewed imaging studies, participated in medical decision making and plan of care.ROS completed by me personally and pertinent positives fully documented  I have made any additions or clarifications directly to the above note. Agree with note above.    Antony Contras, MD Medical Director [facility] Pager: [phone] 04/12/2017 3:57 PM

## 2017-04-11 NOTE — Progress Notes (Signed)
STROKE TEAM PROGRESS NOTE   SUBJECTIVE (INTERVAL HISTORY) Her daughter and granddaughter is at the bedside.  Palliative care team involved and plan to transition to home hospice tomorrow. Family agrees and understood.   OBJECTIVE Temp:  [97.7 F (36.5 C)-100.2 F (37.9 C)] 97.7 F (36.5 C) (06/23 2039) Pulse Rate:  [87-109] 109 (06/23 2039) Resp:  [15-18] 18 (06/23 2039) BP: (119-165)/(64-81) 165/81 (06/23 2039) SpO2:  [96 %-100 %] 100 % (06/23 2039)  CBC:  Recent Labs Lab 04/07/17 1530  04/09/17 0554 04/10/17 0505  WBC 6.1  < > 9.4 12.4*  NEUTROABS 3.9  --   --   --   HGB 13.5  < > 13.0 13.7  HCT 39.9  < > 39.2 40.9  MCV 88.1  < > 89.1 88.9  PLT 188  < > 212 206  < > = values in this interval not displayed.  Basic Metabolic Panel:   Recent Labs Lab 04/08/17 1609 04/09/17 0554 04/10/17 0505  NA  --  135 135  K  --  3.1* 2.9*  CL  --  102 102  CO2  --  27 23  GLUCOSE  --  119* 111*  BUN  --  7 9  CREATININE  --  0.47 0.55  CALCIUM  --  8.7* 8.7*  MG 1.9 1.8  --   PHOS 3.0 3.1  --     Lipid Panel: No results found for: CHOL, TRIG, HDL, CHOLHDL, VLDL, LDLCALC HgbA1c: No results found for: HGBA1C Urine Drug Screen: No results found for: LABOPIA, COCAINSCRNUR, LABBENZ, AMPHETMU, THCU, LABBARB  Alcohol Level     Component Value Date/Time   ETH <5 04/07/2017 1532    IMAGING I have personally reviewed the radiological images below and agree with the radiology interpretations.  Ct Head Code Stroke W/o Cm 04/07/2017 1. Recurrent acute parenchymal hemorrhage in the left hemisphere. Epicenter today is the left frontal operculum. Intra-axial blood volume estimated at 76 mL.  2. Moderate intraventricular extension of hemorrhage, but no acute ventriculomegaly.  3. Regional mass effect in the left hemisphere but no midline shift or loss of basilar cisterns at this time.  4. ASPECTS is not applicable, acute hemorrhage.  MR Brain wo Contrast 04/08/2017 Recurrent left  frontal lobe hematoma is stable. Intraventricular hemorrhage stable. 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 Smaller areas of hemorrhage in the right frontal lobe and left parietal lobe. Subarachnoid hemorrhage. 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  TTE  04/09/2017 Study [id number]  - Left ventricle: The cavity size was normal. Wall thickness was   increased in a pattern of mild LVH. Systolic function was normal.   The estimated ejection fraction was in the range of 60% to 65%. - Aortic valve: Transvalvular velocity was within the normal range.   There was no stenosis. There was no regurgitation. - Mitral valve: Transvalvular velocity was within the normal range.   There was no evidence for stenosis. There was no regurgitation. - Right ventricle: The cavity size was normal. Wall thickness was   normal. Systolic function was normal. - Atrial septum: No defect or patent foramen ovale was identified. - Tricuspid valve: There was mild regurgitation. - Pulmonary arteries: PA peak pressure: 31 mm Hg (S).  EEG  1) right frontotemporal slow activity 2) generalized irregular slow activity 3) slow PDR Clinical Interpretation: This EEG is consistent with a generalized non-specific cerebral dysfunction(encephalopathy) with evidence also of a superimposed right frontotemporal cerebral dysfunction. There was no seizure or seizure  predisposition recorded on this study.    PHYSICAL EXAM  Temp:  [97.7 F (36.5 C)-100.2 F (37.9 C)] 97.7 F (36.5 C) (06/23 2039) Pulse Rate:  [87-109] 109 (06/23 2039) Resp:  [15-18] 18 (06/23 2039) BP: (119-165)/(64-81) 165/81 (06/23 2039) SpO2:  [96 %-100 %] 100 % (06/23 2039)  General - Well nourished, well developed, in coma.  Ophthalmologic - Fundi not visualized due to noncooperation.  Cardiovascular - Regular rate and rhythm.  Neuro - examination limited due to comfort care measures. comatose,  nonresponsive, not open eyes on voice, not following commands. Snoreous breathing, right facial droop. No spontaneous movement of all extremities.   ASSESSMENT/PLAN Ms. LAIA WILEY is a 76 y.o. female with history of Alzheimer's dementia, hypertension, PVCs, and a recent [place] who presents with recurrent ICH. She did not receive IV t-PA due to [place].   Stroke:  acute recurrent left frontal ICH with moderate extension into the left ventricle and mass effect. 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 Of note, pt does have hx of HTN but felt less likely in this scenario.   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 No cardiac source of emboli identified.  Continued home Keppra 500 BID for seizure prophylaxis  SCDs for VTE prophylaxis Diet NPO time specified  aspirin 81 mg daily prior to admission, now on No antithrombotic.   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  Recommend to avoid antithrombotics   Disposition:  home hospice tomorrow  [facility] of ICH and [facility]  01/2017 left frontal ICH - treated in [place] with NSG and steroids - sent to rehab for 3 weeks and then back home with 24h caregiver  03/19/17 repeat CT showed left frontal small multifocal SAH   With current recurrent left frontal cortical ICH, the process suggesting CAA  AD  Diagnosed 5 years ago  On aricept  Baseline before first ICH 80% lucid and 20% confusion. 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  Hypertension  stable   Off BP meds due to comfort care measures  Dysphagia  Failed swallow  No feeding  tube due to comfort care measures  Other Stroke Risk Factors  Advanced age   ETOH use  Other Active Problems  Asthma  DNR/DNI status  Hospital day # 4   Rosalin Hawking, MD PhD Stroke Neurology 04/11/2017 3:21 PM   To contact Stroke Continuity provider, please refer to [email or web address] After hours, contact General Neurology

## 2017-04-12 DIAGNOSIS — I680 Cerebral amyloid angiopathy: Secondary | ICD-10-CM

## 2017-04-12 DIAGNOSIS — E854 Organ-limited amyloidosis: Secondary | ICD-10-CM | POA: Diagnosis present

## 2017-04-12 DIAGNOSIS — I69391 Dysphagia following cerebral infarction: Secondary | ICD-10-CM

## 2017-04-12 DIAGNOSIS — J45909 Unspecified asthma, uncomplicated: Secondary | ICD-10-CM | POA: Diagnosis present

## 2017-04-12 DIAGNOSIS — Z8679 Personal history of other diseases of the circulatory system: Secondary | ICD-10-CM

## 2017-04-12 MED ORDER — ACETAMINOPHEN 650 MG RE SUPP: 650.0000 mg | RECTAL | 0 refills | Status: AC | PRN

## 2017-04-12 MED ORDER — LORAZEPAM 0.5 MG PO TABS: 0.5000 mg | ORAL_TABLET | Freq: Four times a day (QID) | ORAL | 0 refills | Status: AC | PRN

## 2017-04-12 MED ORDER — SCOPOLAMINE 1 MG/3DAYS TD PT72: 1.0000 | MEDICATED_PATCH | TRANSDERMAL | 12 refills | Status: AC

## 2017-04-12 MED ORDER — MORPHINE SULFATE (CONCENTRATE) 10 MG/0.5ML PO SOLN: 10.0000 mg | ORAL | 0 refills | Status: AC | PRN

## 2017-04-12 MED ORDER — LORAZEPAM 0.5 MG PO TABS: 0.5000 mg | ORAL_TABLET | Freq: Four times a day (QID) | ORAL | Status: DC | PRN

## 2017-04-12 MED ORDER — MORPHINE SULFATE (CONCENTRATE) 10 MG/0.5ML PO SOLN: 10.0000 mg | ORAL | Status: DC | PRN

## 2017-04-12 NOTE — Progress Notes (Signed)
Discharge home with hospice. Discharge instruction given to daughter and caregiver. Patient is resting comfortably at this time. Awaiting PTAR.

## 2017-04-12 NOTE — Progress Notes (Signed)
Patient transported by PTAR to home.

## 2017-04-12 NOTE — Consult Note (Signed)
[facility]: Verified from family equipment was delivered and in place.  Met with family they will call us when pt arrives home for [place] to transition them to hospice care in home. The pt is minimally responsive at this time. Family gave verbal instructions on how to administer the morphine and when to use the lorazepam when at home. Told the RN will also go over this as well. The pt's daughter is very appreciative of the help and thank you for this referral giving [place] the opportunity to help in our community. Webb Silversmith RN

## 2017-04-12 NOTE — Care Management Note (Addendum)
Case Management Note  Patient Details  Name: Kayla Hays MRN: [id number] Date of Birth: [birth year]  Subjective/Objective:                    Action/Plan:  Spoke with daughter [name] at bedside and [facility] via phone. They would like early discharge home and prescriptions for Morphine and Lorazepam.   Placed DNR form on chart.   Confirmed address with [name] , will arrange ambulance transport home.   Paged MD DR Leonie Man  Prescriptions done, discharge summary completed, gold DNR form signed.   Family and bedside nurse aware and ready for ambulance to be called, same done. Cheri with [facility] aware. Expected Discharge Date:                  Expected Discharge Plan:  Home w Hospice Care  In-House Referral:     Discharge planning Services  CM Consult  Post Acute Care Choice:    Choice offered to:  Patient, Adult Children  DME Arranged:  N/A DME Agency:  NA  HH Arranged:  NA HH Agency:  [facility]  Status of Service:  In process, will continue to follow  If discussed at Long Length of Stay Meetings, dates discussed:    Additional Comments:  Marilu Favre, RN 04/12/2017, 9:54 AM

## 2017-04-13 NOTE — Consult Note (Signed)
           [facility] CM Primary Care Navigator  04/13/2017  Kayla Hays 08-24-1941 [id number]   Went to see patient earlier at the bedside to identify possible discharge needs but she was already discharged yesterday.  Electronic medical record shows that patient has end stage dementia with diagnosis of recurrent acute hemorrhage in the left hemisphere. She had been unresponsive since admission with advance directive stating no artificial feeding. Family was interested in hospice and comfort care. Palliative Care team was involved.  Patient was discharged home with Hospice Care from [place].  No health management needed at this point. Patient's PCP:  Malka So, MD (listed as Inactive)   For questions, please contact:  Dannielle Huh, BSN, RN- [facility] Primary Care Navigator  Telephone: [phone] [place]

## 2017-04-18 DEATH — deceased

## 2017-04-20 ENCOUNTER — Telehealth: Payer: Self-pay | Admitting: Emergency Medicine

## 2017-04-20 NOTE — Telephone Encounter (Signed)
LOST TO FOLLOWUP 

## 2017-07-06 ENCOUNTER — Telehealth: Payer: Self-pay | Admitting: Neurology

## 2017-07-06 NOTE — Telephone Encounter (Signed)
Vinnie Level Andia(states she is a friend of family) she stated pt passed on 05-04-17 after being sent home from [facility].  There is a vacation that the family is seeking to be reimbursed for and was told to ask for a note from Dr Erlinda Hong.  This Ella Bodo is not on DPR and was told that per Dr Erlinda Hong the immediate family (example daughter) needs to call.  RN spoke with Dr.Xu and he  advised that Ella Bodo notify family member to contact pt PCP since pt died at home,  She stated she will relay this to pt daughter(Wendy)

## 2017-07-06 NOTE — Telephone Encounter (Signed)
I am so sorry to hear this. Please send my condolence to the family and let them contact PCP. Thanks.   Rosalin Hawking, MD PhD Stroke Neurology 07/06/2017 4:57 PM

## 2017-08-14 IMAGING — CT CT HEAD W/O CM
5 of 6 series · 19 of 47 positions shown, 21 images · non-contrast
Comparison: CT HEAD January 23, 2017

CLINICAL DATA: Altered mental status. Recent hemorrhage. History of
Alzheimer's disease, hypertension.

EXAM:
CT HEAD WITHOUT CONTRAST
TECHNIQUE: Contiguous axial images were obtained from the base of the skull
through the vertex without intravenous contrast.

[Series 201: head w/o, idose (1) · axial · non-contrast · 0.42mm/px · z∈[+92,+202]mm · 6 of 32 slices shown, 8 images (1 of 2)]
[im 5/32  brain]
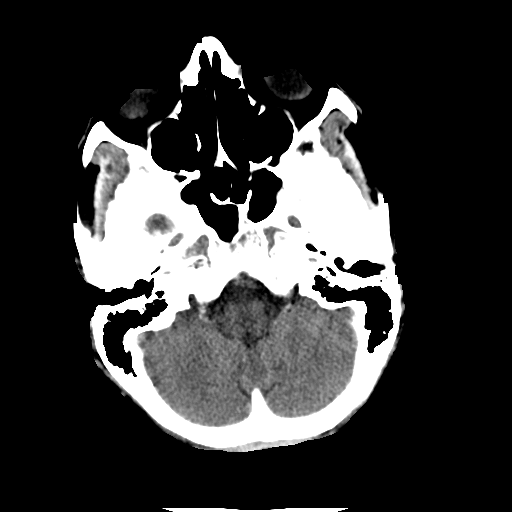
[im 5/32  bone]
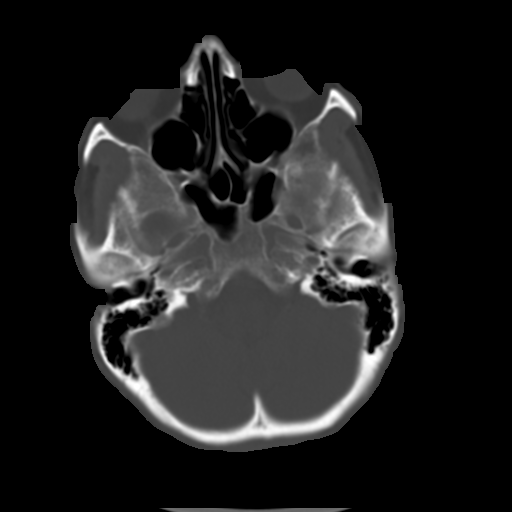
[im 9/32  brain]
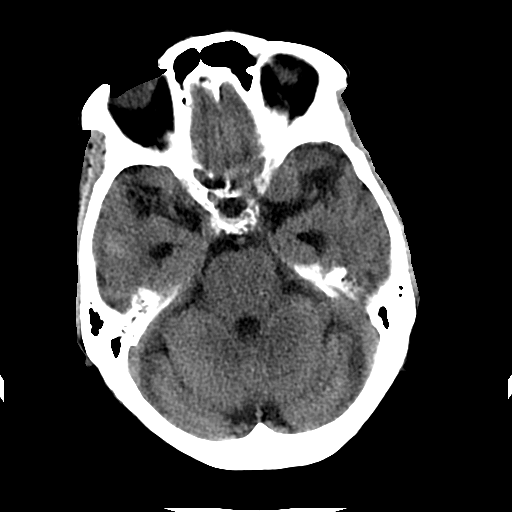
[im 14/32  brain]
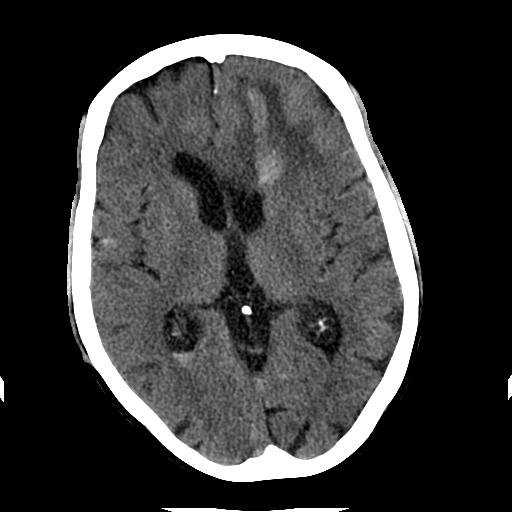
[im 18/32  brain]
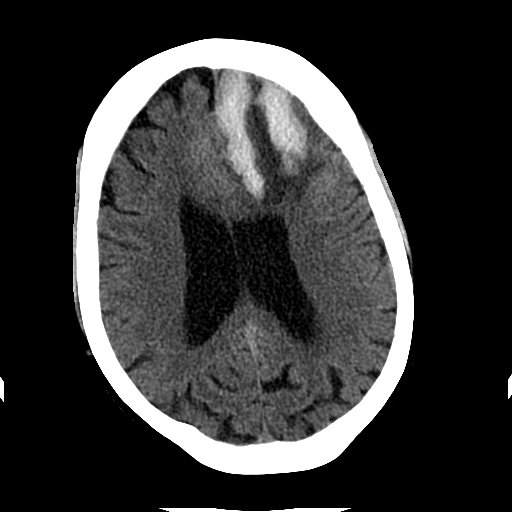
[im 23/32  brain]
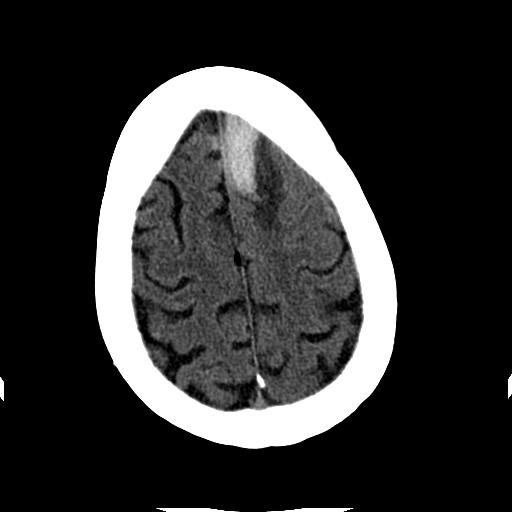
[im 23/32  bone]
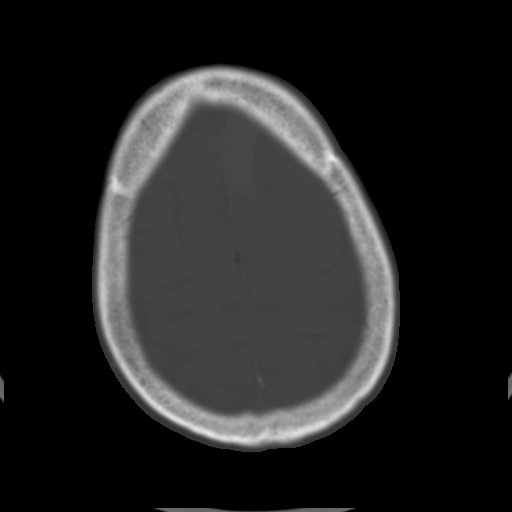
[im 27/32  brain]
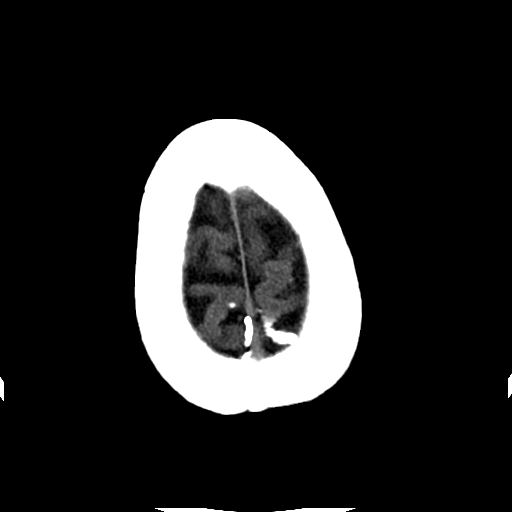

[Series 202: head w/o bone, idose (1) · axial · non-contrast · 0.42mm/px · z∈[+92,+112]mm · 2 of 32 slices shown]
[im 5/32  bone]
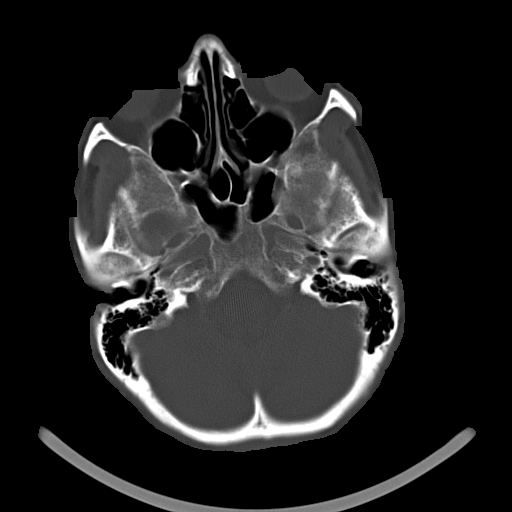
[im 9/32  bone]
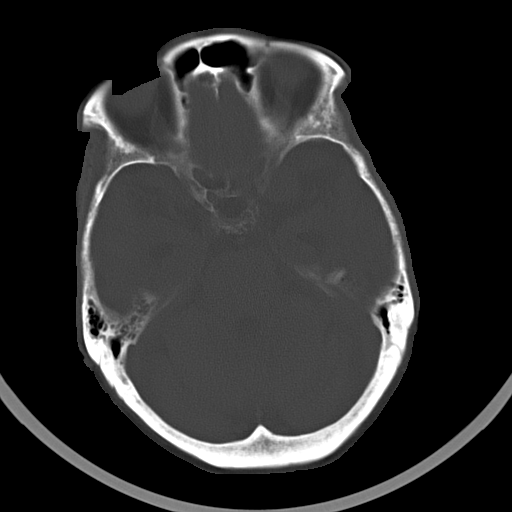

[Series 203: coronal st, idose (1) · coronal · 0.40mm/px · 3 of 71 slices shown]
[im 24/71  brain]
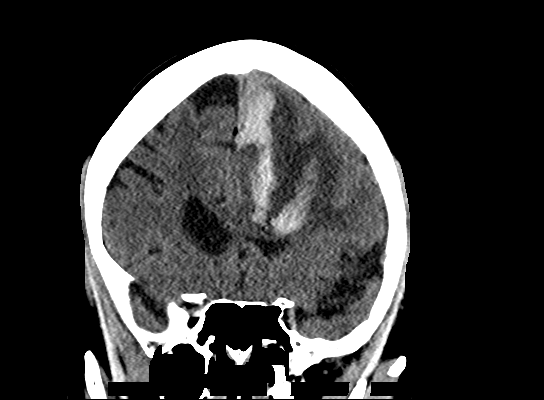
[im 32/71  brain]
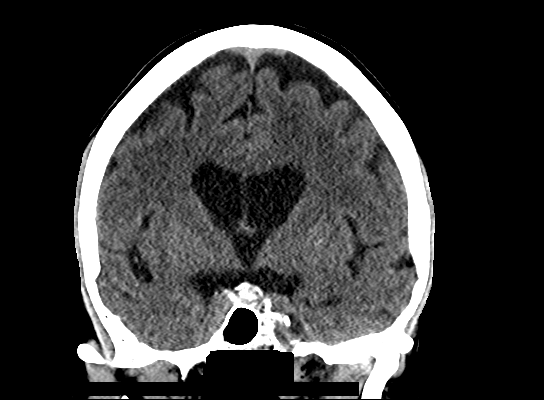
[im 39/71  brain]
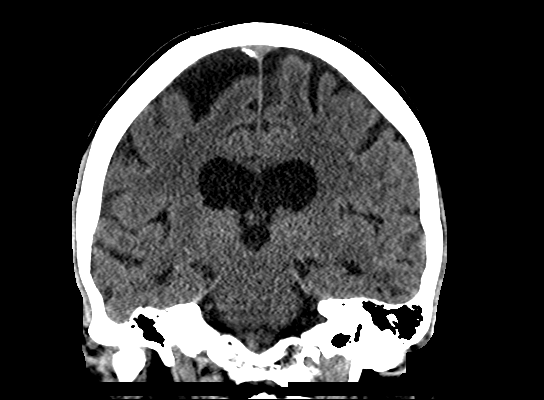

[Series 204: sagittal st, idose (1) · sagittal · 0.40mm/px · 3 of 72 slices shown]
[im 24/72  brain]
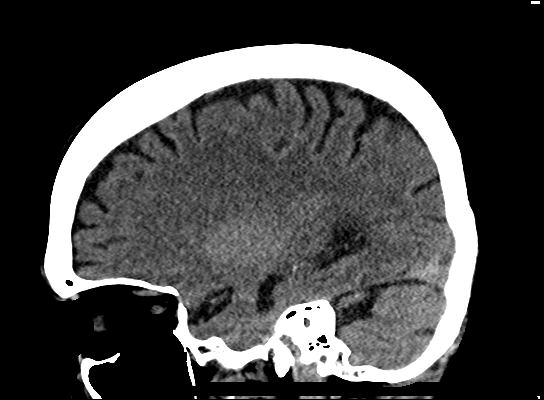
[im 36/72  brain]
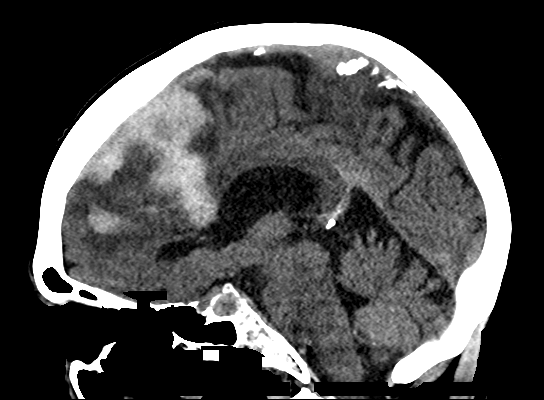
[im 48/72  brain]
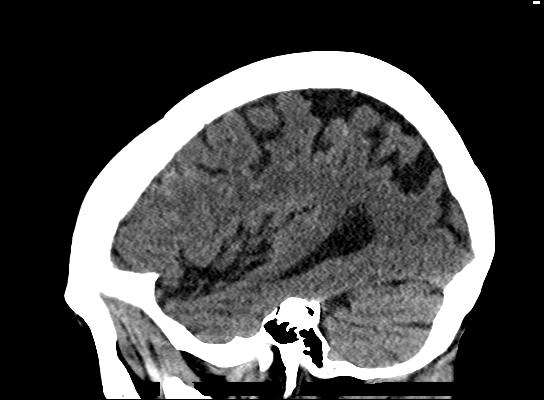

[Series 206: head w/o, idose (1) · axial · non-contrast · 0.49mm/px · z∈[+92,+192]mm · 5 of 30 slices shown (2 of 2)]
[im 5/30  brain]
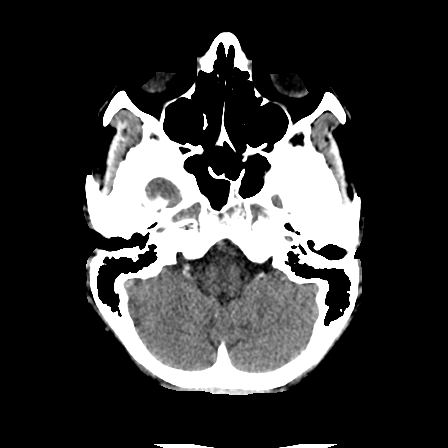
[im 10/30  brain]
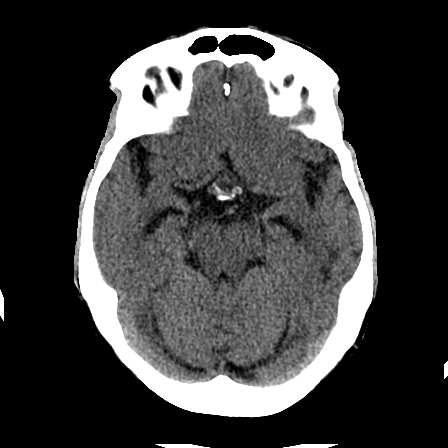
[im 15/30  brain]
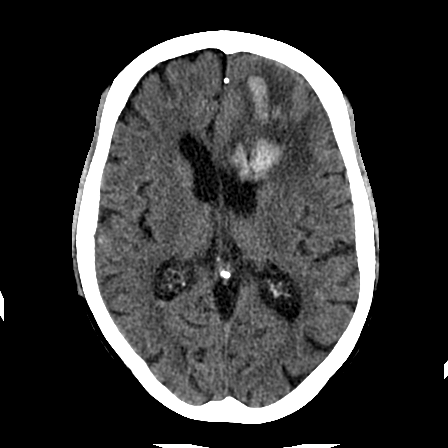
[im 20/30  brain]
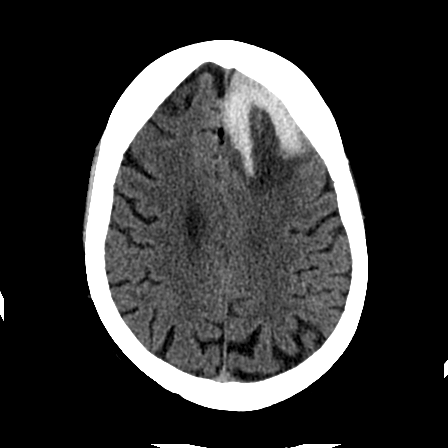
[im 25/30  brain]
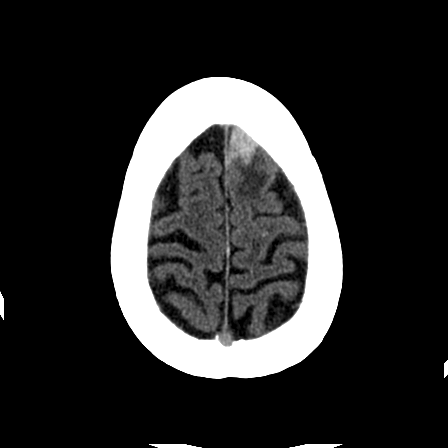

[19 of 47 positions shown; findings below may reference images not displayed]

FINDINGS: BRAIN: Evolving large LEFT frontal intraparenchymal hematoma similar
to prior examination with local mass effect on the frontal horn of
lateral ventricle. Regional mass effect without significant midline
shift. Intraventricular extension with small amount of residual
dependent blood products in the occipital horns. Moderate
ventriculomegaly is similar, on the basis of global parenchymal
brain volume loss. No hydrocephalus. Small amount of redistributed
subarachnoid hemorrhage. No new hemorrhage. No acute large vascular
territory infarct. Basal cisterns are patent.

VASCULAR: Mild calcific atherosclerosis of the carotid siphons.

SKULL: No skull fracture. Osteopenia. No significant scalp soft
tissue swelling.

SINUSES/ORBITS: The mastoid air-cells and included paranasal sinuses
are well-aerated.The included ocular globes and orbital contents are
non-suspicious.

OTHER: None.
IMPRESSION: Evolving, similar large LEFT frontal lobe hematoma. Small amount of
residual intraventricular blood products without hydrocephalus.

Small amount of redistributed subarachnoid hemorrhage.
